# Patient Record
Sex: Male | Born: 1965 | Race: White | Hispanic: No | Marital: Single | Smoking: Current every day smoker
Health system: Southern US, Community
[De-identification: ages and names within clinical notes are randomized; demographics above are authoritative.]

## PROBLEM LIST (undated history)

## (undated) DIAGNOSIS — E785 Hyperlipidemia, unspecified: Secondary | ICD-10-CM

## (undated) DIAGNOSIS — Z720 Tobacco use: Secondary | ICD-10-CM

## (undated) DIAGNOSIS — I255 Ischemic cardiomyopathy: Secondary | ICD-10-CM

## (undated) DIAGNOSIS — I10 Essential (primary) hypertension: Secondary | ICD-10-CM

## (undated) DIAGNOSIS — I2510 Atherosclerotic heart disease of native coronary artery without angina pectoris: Secondary | ICD-10-CM

## (undated) DIAGNOSIS — I219 Acute myocardial infarction, unspecified: Secondary | ICD-10-CM

## (undated) HISTORY — PX: APPENDECTOMY: SHX54

## (undated) HISTORY — DX: Hyperlipidemia, unspecified: E78.5

## (undated) HISTORY — DX: Atherosclerotic heart disease of native coronary artery without angina pectoris: I25.10

## (undated) HISTORY — DX: Ischemic cardiomyopathy: I25.5

## (undated) HISTORY — DX: Tobacco use: Z72.0

## (undated) HISTORY — DX: Essential (primary) hypertension: I10

## (undated) HISTORY — DX: Acute myocardial infarction, unspecified: I21.9

---

## 2010-07-17 DIAGNOSIS — I2510 Atherosclerotic heart disease of native coronary artery without angina pectoris: Secondary | ICD-10-CM

## 2010-07-17 HISTORY — DX: Atherosclerotic heart disease of native coronary artery without angina pectoris: I25.10

## 2010-07-17 HISTORY — PX: CARDIAC CATHETERIZATION: SHX172

## 2010-07-17 HISTORY — PX: CORONARY ANGIOPLASTY WITH STENT PLACEMENT: SHX49

## 2010-07-21 ENCOUNTER — Inpatient Hospital Stay (HOSPITAL_COMMUNITY)
Admission: RE | Admit: 2010-07-21 | Discharge: 2010-07-23 | Payer: Self-pay | Source: Home / Self Care | Attending: Cardiovascular Disease | Admitting: Cardiovascular Disease

## 2010-08-06 ENCOUNTER — Ambulatory Visit: Payer: Self-pay | Admitting: Physician Assistant

## 2010-08-06 ENCOUNTER — Encounter: Payer: Self-pay | Admitting: Physician Assistant

## 2010-08-06 DIAGNOSIS — F17200 Nicotine dependence, unspecified, uncomplicated: Secondary | ICD-10-CM

## 2010-08-06 DIAGNOSIS — I10 Essential (primary) hypertension: Secondary | ICD-10-CM

## 2010-08-06 DIAGNOSIS — E785 Hyperlipidemia, unspecified: Secondary | ICD-10-CM

## 2010-08-06 DIAGNOSIS — I2510 Atherosclerotic heart disease of native coronary artery without angina pectoris: Secondary | ICD-10-CM | POA: Insufficient documentation

## 2010-08-06 DIAGNOSIS — I2119 ST elevation (STEMI) myocardial infarction involving other coronary artery of inferior wall: Secondary | ICD-10-CM

## 2010-08-06 DIAGNOSIS — F1010 Alcohol abuse, uncomplicated: Secondary | ICD-10-CM | POA: Insufficient documentation

## 2010-08-06 DIAGNOSIS — R21 Rash and other nonspecific skin eruption: Secondary | ICD-10-CM

## 2010-09-15 ENCOUNTER — Ambulatory Visit
Admission: RE | Admit: 2010-09-15 | Discharge: 2010-09-15 | Payer: Self-pay | Source: Home / Self Care | Attending: Cardiovascular Disease | Admitting: Cardiovascular Disease

## 2010-09-18 LAB — CONVERTED CEMR LAB
CO2: 23 meq/L (ref 19–32)
Chloride: 105 meq/L (ref 96–112)

## 2010-09-18 NOTE — Assessment & Plan Note (Signed)
Summary: eph/jml   Visit Type:  EPH Primary Provider:  NO PCP AT THIS TIME  CC:  pt states no cardIac complaints today..pt did not have orthostatics these were put in on the wrong pt.  History of Present Illness: Primary Cardiologist:  Dr. Tonny Bollman  Jeffery Prince is a 45 year old male who presented to [facility] on December 5 with an inferior ST elevation myocardial infarction.  He was treated with a bare-metal stent to the right posterior lateral branch which was the infarct vessel.  He also underwent IVUS guided PCI with a bare-metal stent to the PDA and the proximal RCA.  His ejection fraction was 45-50% with inferior and inferobasal akinesis.  He returns today for followup.  He is doing well.  He denies chest pain.  He denies shortness of breath.  He denies syncope.  He denies orthopnea, PND or edema.  He is tolerating his medications well.  He is compliant with his medications.  He has a rash.  This is on his upper extremities.  He usually gets this in the wintertime.  He began to develop the rash prior to going to the hospital with his MI.  It is not significantly different from the past.  Current Medications (verified): 1)  Aspirin 81 Mg Tbec (Aspirin) .... Take One Tablet By Mouth Daily 2)  Nitrostat 0.4 Mg Subl (Nitroglycerin) .Marland Kitchen.. 1 Tablet Under Tongue At Onset of Chest Pain; You May Repeat Every 5 Minutes For Up To 3 Doses. 3)  Pravastatin Sodium 40 Mg Tabs (Pravastatin Sodium) .Marland Kitchen.. 1 Tab At Bedtime 4)  Lisinopril 10 Mg Tabs (Lisinopril) .Marland Kitchen.. 1 Tab Once Daily 5)  Effient 10 Mg Tabs (Prasugrel Hcl) .Marland Kitchen.. 1 Tab Once Daily 6)  Metoprolol Tartrate 25 Mg Tabs (Metoprolol Tartrate) .Marland Kitchen.. 1 Tab Two Times A Day  Allergies (verified): No Known Drug Allergies  Past History:  Past Medical History: Last updated: 08/05/2010 CAD   a.  07/21/2010: Inf STEMI tx with BMS to PL Br. of RCA (infarct vessel) . . . [place]   b.  PCI at time of MI 07/21/2010: IVUS guided BMS  to PDA and Prox RCA   c.  cath 07/21/2010: residual - LM 30-40%; LAD 50% prox + 30-40% dist; D1 50% prox; CFX serial 30-40%;  Ischemic Cardiomyopathy   a.  EF 45-50% with inf and inf-basal AK (cath 07/21/2010) Ongoing tobacco abuse.  EtOH abuse.  Dyslipidemia  Hypertension  Social History: Last updated: 08/05/2010 The patient is unmarried and has no children.   He works in Surveyor, quantity.   He smokes one and a half packs per day.   He has admitted to heavy alcohol use.   Review of Systems       As per  the HPI.  All other systems reviewed and negative.   Vital Signs:  Patient profile:   45 year old male Height:      68 inches Weight:      206.50 pounds BMI:     31.51 Pulse rate:   64 / minute Pulse (ortho):   73 / minute Pulse rhythm:   regular BP sitting:   118 / 82  (left arm) BP standing:   134 / 89 Cuff size:   regular  Vitals Entered By: Danielle Rankin, CMA (August 06, 2010 9:27 AM)  Serial Vital Signs/Assessments:  Time      Position  BP       Pulse  Resp  Temp  By 9:43 AM   Lying RA  128/79   [address], [place] 9:46      Sitting   124/84   [address] [place] 9:48      Standing  134/89   [address], [place] 3'08      Standing  139/90   72                    Danielle Rankin, [place] 9:55      Standing  142/93                         Danielle Rankin, CMA  Comments: 9:43 AM no sxms By: Danielle Rankin, CMA  9:46 dizzy By: Danielle Rankin, CMA  9:48 dizzy By: Danielle Rankin, CMA  [phone] dizzy By: Danielle Rankin, CMA  9:55 dizzy By: Danielle Rankin, CMA  Of note, the above orthostatics were entered on the wrong patient.  Physical Exam  General:  Well nourished, well developed, in no acute distress HEENT: normal Neck: no JVD Cardiac:  normal S1, S2; RRR; no murmur Lungs:  clear to auscultation bilaterally, no wheezing, rhonchi or rales Abd: soft, nontender, no hepatomegaly Ext: no edema; R radial site without  hematoma or bruit Skin: warm and dry; scattered erythematous dry patches to bilat upper ext Neuro:  CNs 2-12 intact, no focal abnormalities noted    EKG  Procedure date:  08/06/2010  Findings:      Normal Sinus Rhythm Heart rate 64 Normal axis Early repolarization noted No ischemic changes  Impression & Recommendations:  Problem # 1:  ACUT MYOCARD INFARCT OTH INF WALL EPIS CARE UNS (ICD-410.40)  He is doing well.  He is continuing his aspirin and Effient.  We will make sure he is enrolled in the assistance program and provide him with samples if available.  He is unable to proceed with cardiac rehabilitation due to his job.  He has already gone back to work.  We discussed possibly doing half-time for a while.  He will try to do this.  He does not do that much exertion.  He does live in [place] and would like to followup there.  I will bring him back in 6 weeks for routine followup and to meet Dr. Kirke Corin.  Orders: EKG w/ Interpretation (93000)  Problem # 2:  CORONARY ATHEROSCLEROSIS NATIVE CORONARY ARTERY (ICD-414.01)  Continue ASA and Effient.  Orders: EKG w/ Interpretation (93000) TLB-BMP (Basic Metabolic Panel-BMET) (80048-METABOL)  Problem # 3:  HYPERTENSION, BENIGN (ICD-401.1)  Controlled. Check BMET as ACE is new.  Orders: EKG w/ Interpretation (93000) TLB-BMP (Basic Metabolic Panel-BMET) (80048-METABOL)  Problem # 4:  HYPERLIPIDEMIA (ICD-272.4) Check FLP and LFTs in 8 weeks.  His updated medication list for this problem includes:    Pravastatin Sodium 40 Mg Tabs (Pravastatin sodium) .Marland Kitchen... 1 tab at bedtime  Problem # 5:  SKIN RASH (ICD-782.1) It looks like dry skin.  It does not appear a drug reaction. I have rec. changes to his hygiene. I have given him TAC cream to use as needed. If the rash is getting worse or changing, he should follow  up.  Patient Instructions: 1)  Try to use [place] soap instead of Dial. 2)  Try to avoid using soap on your entire body  every day in the shower to avoid drying. 3)  Put a moisturizer on after your shower every day. 4)  Use the triamcinolone two times a day as needed.  Just apply a small amount to any spots that are itchy. 5)  You can get Triamcinolone at [facility] for $4. 6)  Do not ever run out of Effient. 7)  Do not ever stop taking Aspirin. 8)  Your physician recommends that you schedule a follow-up appointment in: IN 6 WEEKS WITH DR. Kirke Corin IN [facility] OFFICE 9)  Your physician recommends that you return for a FASTING lipid profile: LIPIDS AND LIVER PROFILE IN 8 WEEKS 10)  Your physician recommends that you return for lab work EA:VWUJW FOR BMET FOR HYPERTENSION 11)  Your physician recommends you become established with a Primary Care Physician in [place]-to call [place] office for phone numbers. [place] office phone number [phone]. Prescriptions: TRIAMCINOLONE ACETONIDE 0.1 % CREA (TRIAMCINOLONE ACETONIDE) Apply thin amount to areas that itch two times a day as needed  #30 grams x 0   Entered and Authorized by:   Tereso Newcomer PA-C   Signed by:   Tereso Newcomer PA-C on 08/06/2010   Method used:   Print then Give to Patient   RxID:   [id number]

## 2010-09-22 ENCOUNTER — Telehealth: Payer: Self-pay | Admitting: Cardiovascular Disease

## 2010-09-29 NOTE — Assessment & Plan Note (Signed)
[facility]                       [place] [facility] OFFICE NOTE  NAME:Jeffery Prince, Jeffery Prince                        MRN:          [id number] DATE:09/15/2010                            DOB:          1965/12/27   HISTORY OF PRESENT ILLNESS:  Jeffery Prince is a 45 year old gentleman who is here today for a followup visit.  He has the following problem list: 1. Coronary artery disease status post inferior ST elevation     myocardial infarction in December 2011.  Cardiac catheterization at     that time showed a total occlusion of right posterior AV groove     artery, 80% proximal PDA stenosis, and an 80% proximal RCA     stenosis.  He underwent an angioplasty, and 3 bare metal stent     placement to the right posterior AV groove artery, proximal PDA as     well as proximal right coronary artery.  The rest of his cardiac     catheterization showed 30-40% distal left main stenosis, 50%     proximal LAD stenosis, and 30-40% left circumflex disease.     Ejection fraction was 45-50%. 2. Tobacco use. 3. History of alcohol abuse with binge drinking on weekends. 4. Hypertension. 5. Borderline hyperlipidemia.  INTERVAL HISTORY:  Jeffery Prince has been doing very well since he was treated for his ST elevation myocardial infarction back in December.  He has not had any episodes of chest pain or dyspnea.  He continues to take his medications without any reported side effects.  He cannot afford Effient and has been dependent on samples and assistance.  He has cut down significantly on his alcohol drinking.  He continues to smoke so and is having hard time quitting.  He is not involved in regular exercise program.  MEDICATIONS: 1. Aspirin 81 mg once daily. 2. Pravastatin 40 mg once daily. 3. Lisinopril 10 mg once daily. 4. Effient 10 mg once daily. 5. Metoprolol 25 mg twice daily.  ALLERGIES:  No known drug allergies.  SOCIAL HISTORY:  Remarkable for smoking half-a-pack  per day for many years.  He used to binge drink beer on weekends but has cut down significantly.  He works in a Interior and spatial designer.  PAST SURGICAL HISTORY:  Appendectomy.  PHYSICAL EXAMINATION:  GENERAL:  The patient appears to be at his stated age and in no acute distress. VITAL SIGNS:  Weight is 211.8 pounds, blood pressure is 122/76, pulse is 69, oxygen saturation is 96% on room air. HEENT:  Normocephalic and atraumatic. NECK:  No JVD or carotid bruits. RESPIRATORY:  Normal respiratory effort with no use of accessory muscles.  Clear to auscultation, reveals normal breath sounds. CARDIOVASCULAR:  Normal PMI.  Normal S1 and S2 with no gallops or murmurs. ABDOMEN:  Benign, nontender, and nondistended. EXTREMITIES:  With no clubbing, cyanosis, or edema. SKIN:  Warm and dry with no rash. PSYCHIATRIC:  He is alert, oriented x3 with normal mood and affect. MUSCULOSKELETAL:  There is normal muscle strength in the upper and lower extremities.  IMPRESSION:  Electrocardiogram was performed which showed normal sinus rhythm with no significant ST or  T-wave changes.  There are no Q-waves.  IMPRESSION: 1. Coronary artery disease status post inferior ST elevation     myocardial infarction which was treated emergently with an     angioplasty and 3 bare metal stents to the right coronary artery.     At this time, I agree with current management.  He is not having     any symptoms suggestive of angina, heart failure, or arrhythmia.     He will need to continue aspirin indefinitely.  Regarding Effient,     I think we will treat him with 3 months given that he had a bare     metal stent.  Obviously, he would benefit from continuing the     medication for a total of 9-12 months due to his recent acute     coronary syndrome.  However, he is not able to afford the     medication.  I provided him today with 6 week supplies so we will     ensure that he at least took 3 months of Effient.  I instructed  him     to increase aspirin to 325 mg once daily after he is done with     Effient.  We will continue also with lisinopril 10 mg once daily,     metoprolol, and pravastatin. 2. Hypertension:  Continue current medications.  Blood pressure is     well-controlled. 3. Borderline hyperlipidemia:  We will request a fasting lipid and     liver profile.  We will continue pravastatin for now, but we will     consider switching to simvastatin if his LDL is still above 70.  I     discussed with him the importance of lifestyle changes including     diet, exercise, and smoking cessation. 4. Tobacco use.  The patient was counseled regarding the importance of     smoking cessation.  He will follow up with me in 4 months from now     or earlier as needed.    Lorine Bears, MD Electronically Signed   MA/MedQ  DD: 09/15/2010  DT: 09/15/2010  Job #: [id number]

## 2010-10-02 NOTE — Progress Notes (Signed)
Summary: lab results  Phone Note Outgoing Call   Call placed by: Dessie Coma  LPN,  September 22, 2010 2:43 PM Call placed to: Patient Summary of Call: LMVM-for patient to call this nurse back re: lab results.  Follow-up for Phone Call        Phone call completed:  Sister returned call since patient is at work.  Informed her to tell him labs are fine.  Cholesterol is OK at 13.  His HDL is too low though at 32.2-need to exercise more. Follow-up by: Dessie Coma  LPN,  September 22, 2010 2:46 PM

## 2010-10-28 LAB — COMPREHENSIVE METABOLIC PANEL
ALT: 27 U/L (ref 0–53)
Alkaline Phosphatase: 81 U/L (ref 39–117)
BUN: 2 mg/dL — ABNORMAL LOW (ref 6–23)
Chloride: 104 mEq/L (ref 96–112)
GFR calc Af Amer: >60 mL/min (ref >60)
Glucose, Bld: 99 mg/dL (ref 70–99)

## 2010-10-28 LAB — HEMOGLOBIN A1C
Hgb A1c MFr Bld: 6 % — ABNORMAL HIGH (ref <5.7)
Mean Plasma Glucose: 126 mg/dL — ABNORMAL HIGH (ref <117)

## 2010-10-28 LAB — POCT I-STAT, CHEM 8
BUN: 4 mg/dL — ABNORMAL LOW (ref 6–23)
Calcium, Ion: 1.03 mmol/L — ABNORMAL LOW (ref 1.12–1.32)
Chloride: 99 mEq/L (ref 96–112)
Glucose, Bld: 109 mg/dL — ABNORMAL HIGH (ref 70–99)
HCT: 44 % (ref 39.0–52.0)
Hemoglobin: 15 g/dL (ref 13.0–17.0)
Potassium: 3.6 mEq/L (ref 3.5–5.1)
Sodium: 133 mEq/L — ABNORMAL LOW (ref 135–145)

## 2010-10-28 LAB — BASIC METABOLIC PANEL
BUN: 4 mg/dL — ABNORMAL LOW (ref 6–23)
CO2: 26 mEq/L (ref 19–32)
Calcium: 8.9 mg/dL (ref 8.4–10.5)
Calcium: 9 mg/dL (ref 8.4–10.5)
Chloride: 107 mEq/L (ref 96–112)
GFR calc Af Amer: >60 mL/min (ref >60)
GFR calc non Af Amer: >60 mL/min (ref >60)
GFR calc non Af Amer: >60 mL/min (ref >60)

## 2010-10-28 LAB — DIFFERENTIAL
Basophils Absolute: 0.1 10*3/uL (ref 0.0–0.1)
Basophils Relative: 1 % (ref 0–1)
Eosinophils Absolute: 0 10*3/uL (ref 0.0–0.7)
Lymphs Abs: 2.9 10*3/uL (ref 0.7–4.0)
Monocytes Relative: 7 % (ref 3–12)

## 2010-10-28 LAB — PLATELET INHIBITION P2Y12: Platelet Function Baseline: 219 [PRU] (ref 194–418)

## 2010-10-28 LAB — CARDIAC PANEL(CRET KIN+CKTOT+MB+TROPI)
CK, MB: 1.8 ng/mL (ref 0.3–4.0)
CK, MB: 2.1 ng/mL (ref 0.3–4.0)
Relative Index: 1.8 (ref 0.0–2.5)
Relative Index: 10.4 — ABNORMAL HIGH (ref 0.0–2.5)
Relative Index: INVALID (ref 0.0–2.5)
Troponin I: 0.05 ng/mL (ref 0.00–0.06)
Troponin I: 6.92 ng/mL (ref 0.00–0.06)

## 2010-10-28 LAB — LIPID PANEL
HDL: 32 mg/dL — ABNORMAL LOW (ref >39)
LDL Cholesterol: 104 mg/dL — ABNORMAL HIGH (ref 0–99)
Total CHOL/HDL Ratio: 5.4 RATIO

## 2010-10-28 LAB — CBC
HCT: 46.9 % (ref 39.0–52.0)
MCH: 32.2 pg (ref 26.0–34.0)
MCHC: 33.1 g/dL (ref 30.0–36.0)
Platelets: 233 10*3/uL (ref 150–400)
RDW: 15.6 % — ABNORMAL HIGH (ref 11.5–15.5)
WBC: 9.2 10*3/uL (ref 4.0–10.5)

## 2010-11-04 ENCOUNTER — Telehealth: Payer: Self-pay | Admitting: *Deleted

## 2010-11-04 NOTE — Telephone Encounter (Signed)
A user error has taken place: encounter opened in error, closed for administrative reasons.

## 2010-12-16 HISTORY — PX: CORONARY ANGIOPLASTY WITH STENT PLACEMENT: SHX49

## 2010-12-22 ENCOUNTER — Encounter: Payer: Self-pay | Admitting: Cardiovascular Disease

## 2010-12-25 ENCOUNTER — Other Ambulatory Visit: Payer: Self-pay | Admitting: *Deleted

## 2010-12-25 ENCOUNTER — Ambulatory Visit (INDEPENDENT_AMBULATORY_CARE_PROVIDER_SITE_OTHER): Payer: Self-pay | Admitting: Cardiovascular Disease

## 2010-12-25 ENCOUNTER — Encounter: Payer: Self-pay | Admitting: Cardiovascular Disease

## 2010-12-25 DIAGNOSIS — I2510 Atherosclerotic heart disease of native coronary artery without angina pectoris: Secondary | ICD-10-CM

## 2010-12-25 DIAGNOSIS — F17200 Nicotine dependence, unspecified, uncomplicated: Secondary | ICD-10-CM

## 2010-12-25 DIAGNOSIS — E785 Hyperlipidemia, unspecified: Secondary | ICD-10-CM | POA: Insufficient documentation

## 2010-12-25 DIAGNOSIS — I10 Essential (primary) hypertension: Secondary | ICD-10-CM | POA: Insufficient documentation

## 2010-12-25 DIAGNOSIS — Z720 Tobacco use: Secondary | ICD-10-CM | POA: Insufficient documentation

## 2010-12-25 MED ORDER — PRAVASTATIN SODIUM 40 MG PO TABS: 40.0000 mg | ORAL_TABLET | Freq: Every day | ORAL | Status: DC

## 2010-12-25 MED ORDER — METOPROLOL TARTRATE 25 MG PO TABS: 25.0000 mg | ORAL_TABLET | Freq: Two times a day (BID) | ORAL | Status: DC

## 2010-12-25 MED ORDER — LISINOPRIL 20 MG PO TABS: 20.0000 mg | ORAL_TABLET | Freq: Every day | ORAL | Status: DC

## 2010-12-25 NOTE — Assessment & Plan Note (Signed)
The patient is doing reasonably well from a cardiac standpoint. He is not having any symptoms suggestive of angina, heart failure or arrhythmia. Would continue with medical therapy. He is on aspirin 325 mg once daily. He was on dual antiplatelet therapy for 3 months. Continue treatment with a beta blocker and an ACE inhibitor. I discussed with him the importance of lifestyle changes including healthy diet and regular exercise.

## 2010-12-25 NOTE — Progress Notes (Signed)
HPI  Jeffery Prince is a 45 year old gentleman who is here today for a followup visit. Overall, he has been doing reasonably well. He has not had any chest pain similar to his previous myocardial infarction. He denies any significant dyspnea. There is no palpitations or dizziness. He finished 3 months treatment with Effient. He continues to take his other heart medications. Unfortunately, he continues to smoke one pack per day. He is trying to quit but does not seem to be strongly determined. He mentions that he is under a lot of stress at work.  No Known Allergies   Current Outpatient Prescriptions on File Prior to Visit  Medication Sig Dispense Refill  . DISCONTD: aspirin 81 MG tablet Take 81 mg by mouth daily.        Marland Kitchen DISCONTD: lisinopril (PRINIVIL,ZESTRIL) 10 MG tablet Take 10 mg by mouth daily.        Marland Kitchen DISCONTD: metoprolol tartrate (LOPRESSOR) 25 MG tablet Take 25 mg by mouth 2 (two) times daily.        Marland Kitchen DISCONTD: pravastatin (PRAVACHOL) 40 MG tablet Take 40 mg by mouth daily.        Marland Kitchen DISCONTD: prasugrel (EFFIENT) 10 MG TABS Take 10 mg by mouth daily.           Past Medical History  Diagnosis Date  . Ischemic cardiomyopathy     EF: 45-50% post MI  . MI (myocardial infarction)   . CAD (coronary artery disease) 07/2010    s/p inferior MI  . HLD (hyperlipidemia)   . HTN (hypertension)   . Tobacco abuse      Past Surgical History  Procedure Date  . Appendectomy   . Cardiac catheterization 07/2010    occluded R PAV artery with 80% stenosis in RPDA and proximal RCA, LM: 30-40%, LAD: 50% proximal, LCX: 30-40%.  . Coronary angioplasty with stent placement 07/2010    3 bare metal stents to proximal RCA, RPAV artery and RPDA.     History reviewed. No pertinent family history.   History   Social History  . Marital Status: Single    Spouse Name: N/A    Number of Children: N/A  . 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 Smoking status: Current Everyday Smoker -- 0.5 packs/day  . Smokeless tobacco: Not on file  . Alcohol Use: Yes  . Drug Use: Not on file  . Sexually Active: Not on file   Other Topics Concern  . Not on file   Social History Narrative  . No narrative on file       PHYSICAL EXAM   BP 166/99  Pulse 65  Ht 5\' 8"  (1.727 m)  Wt 204 lb (92.534 kg)  BMI 31.02 kg/m2  SpO2 96%  Constitutional: He is oriented to person, place, and time. He appears well-developed and well-nourished. No distress.  HENT: No nasal discharge.  Head: Normocephalic and atraumatic.  Eyes: Pupils are equal, round, and reactive to light. Right eye exhibits no discharge. Left eye exhibits no discharge.  Neck: Normal range of motion. Neck supple. No JVD present. No thyromegaly present.  Cardiovascular: Normal rate, regular rhythm, normal heart sounds and intact distal pulses. Exam reveals no gallop and no friction rub.  No murmur heard.  Pulmonary/Chest: Effort normal and breath sounds normal. No stridor. No respiratory distress. He has no wheezes. He has no rales. He exhibits no tenderness.  Abdominal: Soft. Bowel  sounds are normal. He exhibits no distension. There is no tenderness. There is no rebound and no guarding.  Musculoskeletal: Normal range of motion. He exhibits no edema and no tenderness.  Neurological: He is alert and oriented to person, place, and time. Coordination normal.  Skin: Skin is warm and dry. No rash noted. He is not diaphoretic. No erythema. No pallor.  Psychiatric: He has a normal mood and affect. His behavior is normal. Judgment and thought content normal.        ASSESSMENT AND PLAN

## 2010-12-25 NOTE — Assessment & Plan Note (Signed)
His most recent lipid profile showed a total cholesterol of 139, HDL 52, triglyceride of 159 and an LDL of 79. Would continue treatment with the pravastatin for now. Due to presence of diffuse coronary artery disease, the patient would benefit from more aggressive control of his lipids. I plan on switching him to generic atorvastatin once it becomes more affordable.

## 2010-12-25 NOTE — Assessment & Plan Note (Signed)
His blood pressure is elevated. I will go ahead and increase lisinopril to 20 mg once daily.

## 2010-12-25 NOTE — Assessment & Plan Note (Signed)
I again advised the patient strongly to quit smoking.

## 2010-12-25 NOTE — Patient Instructions (Addendum)
Your physician has recommended you make the following change in your medication: increase lisinopril to 20 mg daily  Your physician recommends that you schedule a follow-up appointment in: 6 months

## 2011-01-22 ENCOUNTER — Ambulatory Visit (INDEPENDENT_AMBULATORY_CARE_PROVIDER_SITE_OTHER): Payer: Self-pay | Admitting: Cardiovascular Disease

## 2011-01-22 ENCOUNTER — Encounter: Payer: Self-pay | Admitting: Cardiovascular Disease

## 2011-01-22 DIAGNOSIS — E785 Hyperlipidemia, unspecified: Secondary | ICD-10-CM

## 2011-01-22 DIAGNOSIS — Z720 Tobacco use: Secondary | ICD-10-CM

## 2011-01-22 DIAGNOSIS — F17200 Nicotine dependence, unspecified, uncomplicated: Secondary | ICD-10-CM

## 2011-01-22 DIAGNOSIS — I2510 Atherosclerotic heart disease of native coronary artery without angina pectoris: Secondary | ICD-10-CM

## 2011-01-22 NOTE — Assessment & Plan Note (Signed)
Continue treatment with pravastatin. He will likely need a more potent stent in the future once she is able to afford it.

## 2011-01-22 NOTE — Patient Instructions (Signed)
Your physician recommends that you schedule a follow-up appointment in: 4 months  

## 2011-01-22 NOTE — Progress Notes (Signed)
HPI   This is a 45 year old male who is here for followup visit. He has known history of coronary artery disease status post inferior myocardial infarction in November of 2011. He was stable from a cardiac standpoint up until recently when he presented on May 25 to [facility] with chest pain. His ECG showed no acute changes. He did rule out for myocardial infarction with a peak troponin of 8. He was transferred to [facility] after 2 days where he underwent cardiac catheterization which showed severe in-stent restenosis in the right posterior AV groove artery as well as the right PDA. The proximal RCA stent was patent. He underwent an angioplasty and 2 drug-eluting stent placement after [place] angioplasty both branches. 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 Since his hospital discharge, the patient has not had any recurrent chest pain. He is trying to quit smoking. He is taking his medications regularly.   No Known Allergies   Current Outpatient Prescriptions on File Prior to Visit  Medication Sig Dispense Refill  . lisinopril (PRINIVIL,ZESTRIL) 20 MG tablet Take 1 tablet (20 mg total) by mouth daily.  30 tablet  6  . metoprolol tartrate (LOPRESSOR) 25 MG tablet Take 1 tablet (25 mg total) by mouth 2 (two) times daily.  60 tablet  6  . pravastatin (PRAVACHOL) 40 MG tablet Take 1 tablet (40 mg total) by mouth daily.  30 tablet  6  . DISCONTD: aspirin 325 MG tablet Take 325 mg by mouth daily.           Past Medical History  Diagnosis Date  . Ischemic cardiomyopathy     EF: 45-50% post MI  . MI (myocardial infarction)   . CAD (coronary artery disease) 07/2010    s/p inferior MI  . HLD (hyperlipidemia)   . HTN (hypertension)   . Tobacco abuse      Past Surgical History  Procedure Date  . Appendectomy   . Cardiac catheterization 07/2010    occluded R PAV artery with 80% stenosis in RPDA and proximal RCA, LM:  30-40%, LAD: 50% proximal, LCX: 30-40%.  . Coronary angioplasty with stent placement 07/2010    3 bare metal stents to proximal RCA, RPAV artery and RPDA.  Marland Kitchen Coronary angioplasty with stent placement 12/2010    2 DES to R PAV (2.5 X 18 mm Xience)and RPDA (3.0 X 18 mm) for severe ISR     History reviewed. No pertinent family history.   History   Social History  . Marital Status: Single    Spouse Name: N/A    Number of Children: N/A  . 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 Smoking status: Current Everyday Smoker -- 0.5 packs/day  . Smokeless tobacco: Not on file  . Alcohol Use: Yes  . Drug Use: Not on file  . Sexually Active: Not on file   Other Topics Concern  . Not on file   Social History Narrative  . No narrative on file       PHYSICAL EXAM   BP 139/88  Pulse 74  Ht 5\' 8"  (1.727 m)  Wt 199 lb (90.266 kg)  BMI 30.26 kg/m2  SpO2 95%  Constitutional: He is oriented to person, place, and time. He appears well-developed and well-nourished. No distress.  HENT: No nasal discharge.  Head: Normocephalic and atraumatic.  Eyes: Pupils are equal, round,  and reactive to light. Right eye exhibits no discharge. Left eye exhibits no discharge.  Neck: Normal range of motion. Neck supple. No JVD present. No thyromegaly present.  Cardiovascular: Normal rate, regular rhythm, normal heart sounds and intact distal pulses. Exam reveals no gallop and no friction rub.  No murmur heard.  Pulmonary/Chest: Effort normal and breath sounds normal. No stridor. No respiratory distress. He has no wheezes. He has no rales. He exhibits no tenderness.  Abdominal: Soft. Bowel sounds are normal. He exhibits no distension. There is no tenderness. There is no rebound and no guarding.  Musculoskeletal: Normal range of motion. He exhibits no edema and no tenderness.  Neurological: He is alert and oriented to person, place, and time.  Coordination normal.  Skin: Skin is warm and dry. No rash noted. He is not diaphoretic. No erythema. No pallor.  Psychiatric: He has a normal mood and affect. His behavior is normal. Judgment and thought content normal.        ASSESSMENT AND PLAN

## 2011-01-22 NOTE — Assessment & Plan Note (Signed)
He is actually trying to quit smoking and this was discussed with him again during this visit.

## 2011-01-22 NOTE — Assessment & Plan Note (Signed)
The patient had a recent non-ST elevation myocardial infarction due to severe in-stent restenosis in the distal right coronary artery branches. He had an angioplasty and drug-eluting stent placement without complications. He seems to be doing well since then. I recommend continuing dual antiplatelet therapy for one year. I discussed with him the importance of taking Effient daily. I provided him with 6 week supply. I asked him to fill out the paperwork to see if he qualifies for the drug from the company. I again had a prolonged discussion with him about the importance of lifestyle changes.

## 2011-01-26 ENCOUNTER — Encounter: Payer: Self-pay | Admitting: Cardiovascular Disease

## 2011-04-13 ENCOUNTER — Encounter: Payer: Self-pay | Admitting: Cardiovascular Disease

## 2011-12-21 IMAGING — CR DG CHEST 1V PORT
1 series · 1 of 1 positions shown · non-contrast
Comparison: None

CLINICAL DATA: Myocardial infarction.  Short of breath

PORTABLE CHEST - 1 VIEW

[view not recorded]
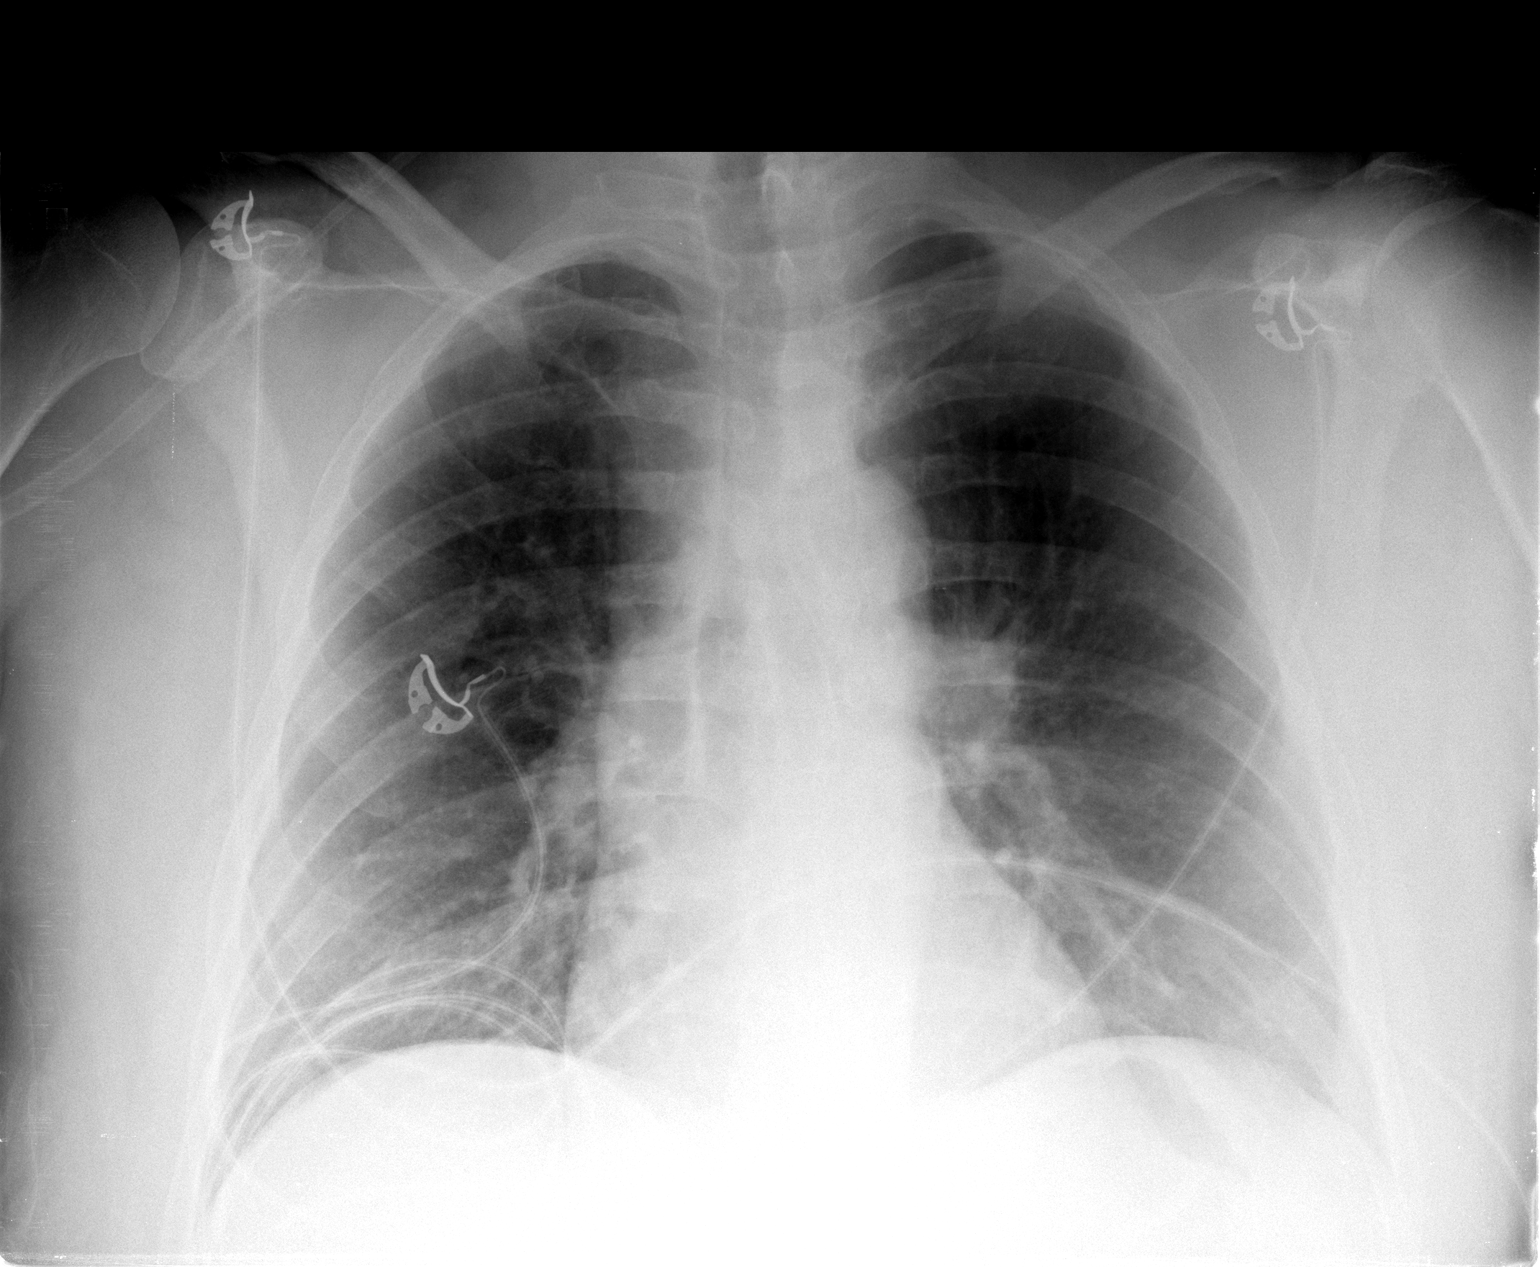

[1 of 1 positions shown; findings below may reference images not displayed]

FINDINGS: Heart size is normal and vascularity is normal.  Lungs
are clear without infiltrate or effusion.
IMPRESSION: No active cardiopulmonary disease.

## 2015-12-30 ENCOUNTER — Inpatient Hospital Stay (HOSPITAL_COMMUNITY)
Admission: AD | Admit: 2015-12-30 | Discharge: 2016-01-07 | DRG: 023 | Disposition: A | Discharge status: Skilled Nursing Facility | Payer: Self-pay | Source: Other Acute Inpatient Hospital | Attending: Neurosurgery | Admitting: Neurosurgery

## 2015-12-30 DIAGNOSIS — E785 Hyperlipidemia, unspecified: Secondary | ICD-10-CM | POA: Diagnosis present

## 2015-12-30 DIAGNOSIS — R4701 Aphasia: Secondary | ICD-10-CM | POA: Diagnosis present

## 2015-12-30 DIAGNOSIS — G936 Cerebral edema: Secondary | ICD-10-CM | POA: Diagnosis present

## 2015-12-30 DIAGNOSIS — K047 Periapical abscess without sinus: Secondary | ICD-10-CM

## 2015-12-30 DIAGNOSIS — K089 Disorder of teeth and supporting structures, unspecified: Secondary | ICD-10-CM | POA: Insufficient documentation

## 2015-12-30 DIAGNOSIS — Z79899 Other long term (current) drug therapy: Secondary | ICD-10-CM

## 2015-12-30 DIAGNOSIS — G060 Intracranial abscess and granuloma: Principal | ICD-10-CM | POA: Diagnosis present

## 2015-12-30 DIAGNOSIS — F17210 Nicotine dependence, cigarettes, uncomplicated: Secondary | ICD-10-CM | POA: Diagnosis present

## 2015-12-30 DIAGNOSIS — I10 Essential (primary) hypertension: Secondary | ICD-10-CM | POA: Diagnosis present

## 2015-12-30 DIAGNOSIS — F1010 Alcohol abuse, uncomplicated: Secondary | ICD-10-CM | POA: Diagnosis present

## 2015-12-30 DIAGNOSIS — Z9114 Patient's other noncompliance with medication regimen: Secondary | ICD-10-CM

## 2015-12-30 DIAGNOSIS — I252 Old myocardial infarction: Secondary | ICD-10-CM

## 2015-12-30 DIAGNOSIS — Z452 Encounter for adjustment and management of vascular access device: Secondary | ICD-10-CM

## 2015-12-30 DIAGNOSIS — D496 Neoplasm of unspecified behavior of brain: Secondary | ICD-10-CM

## 2015-12-30 DIAGNOSIS — I2510 Atherosclerotic heart disease of native coronary artery without angina pectoris: Secondary | ICD-10-CM | POA: Diagnosis present

## 2015-12-30 DIAGNOSIS — G9389 Other specified disorders of brain: Secondary | ICD-10-CM | POA: Insufficient documentation

## 2015-12-30 DIAGNOSIS — R40241 Glasgow coma scale score 13-15, unspecified time: Secondary | ICD-10-CM | POA: Diagnosis present

## 2015-12-30 DIAGNOSIS — I255 Ischemic cardiomyopathy: Secondary | ICD-10-CM | POA: Diagnosis present

## 2015-12-30 DIAGNOSIS — F17200 Nicotine dependence, unspecified, uncomplicated: Secondary | ICD-10-CM | POA: Diagnosis present

## 2015-12-30 DIAGNOSIS — Z955 Presence of coronary angioplasty implant and graft: Secondary | ICD-10-CM

## 2015-12-30 DIAGNOSIS — Z7982 Long term (current) use of aspirin: Secondary | ICD-10-CM

## 2015-12-30 LAB — CBC
HEMATOCRIT: 43.4 % (ref 39.0–52.0)
HEMOGLOBIN: 14.4 g/dL (ref 13.0–17.0)
MCH: 31.2 pg (ref 26.0–34.0)
MCHC: 33.2 g/dL (ref 30.0–36.0)
MCV: 94.1 fL (ref 78.0–100.0)
Platelets: 326 10*3/uL (ref 150–400)
RBC: 4.61 MIL/uL (ref 4.22–5.81)
RDW: 14.2 % (ref 11.5–15.5)
WBC: 11.7 10*3/uL — ABNORMAL HIGH (ref 4.0–10.5)

## 2015-12-30 LAB — BASIC METABOLIC PANEL
ANION GAP: 8 (ref 5–15)
BUN: 7 mg/dL (ref 6–20)
CHLORIDE: 105 mmol/L (ref 101–111)
CO2: 23 mmol/L (ref 22–32)
Calcium: 9 mg/dL (ref 8.9–10.3)
Creatinine, Ser: 0.53 mg/dL — ABNORMAL LOW (ref 0.61–1.24)
GFR calc non Af Amer: >60 mL/min (ref >60)
Glucose, Bld: 96 mg/dL (ref 65–99)
POTASSIUM: 3.6 mmol/L (ref 3.5–5.1)
Sodium: 136 mmol/L (ref 135–145)

## 2015-12-30 LAB — PROTIME-INR
INR: 1.1 (ref 0.00–1.49)
Prothrombin Time: 14.4 seconds (ref 11.6–15.2)

## 2015-12-30 LAB — MRSA PCR SCREENING: MRSA BY PCR: NEGATIVE

## 2015-12-30 MED ORDER — VITAMIN B-1 100 MG PO TABS
100.0000 mg | ORAL_TABLET | Freq: Every day | ORAL | Status: DC
Start: 2015-12-30 — End: 2016-01-07
  Administered 2015-12-30 – 2016-01-07 (×9): 100 mg via ORAL
  Filled 2015-12-30 (×10): qty 1

## 2015-12-30 MED ORDER — ADULT MULTIVITAMIN W/MINERALS CH
1.0000 | ORAL_TABLET | Freq: Every day | ORAL | Status: DC
  Administered 2015-12-30 – 2016-01-07 (×9): 1 via ORAL
  Filled 2015-12-30 (×9): qty 1

## 2015-12-30 MED ORDER — LORAZEPAM 1 MG PO TABS: 1.0000 mg | ORAL_TABLET | Freq: Four times a day (QID) | ORAL | Status: AC | PRN

## 2015-12-30 MED ORDER — PNEUMOCOCCAL VAC POLYVALENT 25 MCG/0.5ML IJ INJ
0.5000 mL | INJECTION | INTRAMUSCULAR | Status: AC
  Administered 2016-01-01: 0.5 mL via INTRAMUSCULAR
  Filled 2015-12-30: qty 0.5

## 2015-12-30 MED ORDER — SODIUM CHLORIDE 0.9 % IV SOLN
1000.0000 mg | Freq: Once | INTRAVENOUS | Status: AC
  Administered 2015-12-30: 1000 mg via INTRAVENOUS
  Filled 2015-12-30: qty 10

## 2015-12-30 MED ORDER — LORAZEPAM 2 MG/ML IJ SOLN
1.0000 mg | Freq: Four times a day (QID) | INTRAMUSCULAR | Status: AC | PRN
  Administered 2016-01-01: 1 mg via INTRAVENOUS
  Filled 2015-12-30: qty 1

## 2015-12-30 MED ORDER — THIAMINE HCL 100 MG/ML IJ SOLN: 100.0000 mg | Freq: Every day | INTRAMUSCULAR | Status: DC

## 2015-12-30 MED ORDER — SODIUM CHLORIDE 0.9 % IV SOLN
500.0000 mg | Freq: Two times a day (BID) | INTRAVENOUS | Status: DC
  Administered 2015-12-31 – 2016-01-03 (×7): 500 mg via INTRAVENOUS
  Filled 2015-12-30 (×10): qty 5

## 2015-12-30 MED ORDER — ATORVASTATIN CALCIUM 80 MG PO TABS
80.0000 mg | ORAL_TABLET | Freq: Every day | ORAL | Status: DC
  Administered 2016-01-01 – 2016-01-06 (×6): 80 mg via ORAL
  Filled 2015-12-30 (×6): qty 1

## 2015-12-30 MED ORDER — FOLIC ACID 1 MG PO TABS
1.0000 mg | ORAL_TABLET | Freq: Every day | ORAL | Status: DC
  Administered 2015-12-30 – 2016-01-07 (×9): 1 mg via ORAL
  Filled 2015-12-30 (×9): qty 1

## 2015-12-30 NOTE — Consult Note (Signed)
Reason for Consult: Left Brain mass Referring Physician: Dr. Gustavo Lah is an 50 y.o. male.  HPI: The patient is a 50 year old white male who went to the [facility] ER today after he "passed out". He underwent a cardiac workup which reportedly was negative. He was worked up with a head CT then a brain MRI which demonstrated a left frontal cystic mass. The patient was transferred to [facility] for further care. A neurosurgical consultation was requested.  Presently the patient is alert and pleasant. He denies headaches. When questioned, his syncopal episode sounds more like a seizure. The patient has a history of alcohol abuse. He admits to smoking marijuana. He denies intravenous drug abuse.  Past Medical History  Diagnosis Date  . Ischemic cardiomyopathy     EF: 45-50% post MI  . MI (myocardial infarction)   . CAD (coronary artery disease) 07/2010    s/p inferior MI  . HLD (hyperlipidemia)   . HTN (hypertension)   . Tobacco abuse     Past Surgical History  Procedure Laterality Date  . Appendectomy    . Cardiac catheterization  07/2010    occluded R PAV artery with 80% stenosis in RPDA and proximal RCA, LM: 30-40%, LAD: 50% proximal, LCX: 30-40%.  . Coronary angioplasty with stent placement  07/2010    3 bare metal stents to proximal RCA, RPAV artery and RPDA.  Marland Kitchen Coronary angioplasty with stent placement  12/2010    2 DES to R PAV (2.5 X 18 mm Xience)and RPDA (3.0 X 18 mm) for severe ISR    No family history on file.  Social History:  reports that he has been smoking.  He does not have any smokeless tobacco history on file. He reports that he drinks alcohol. His drug history is not on file.  Allergies: No Known Allergies  Medications:  I have reviewed the patient's current medications. Prior to Admission:  Prescriptions prior to admission  Medication Sig Dispense Refill Last Dose  . lisinopril (PRINIVIL,ZESTRIL) 20 MG tablet Take 1 tablet (20 mg total)  by mouth daily. 30 tablet 6 Taking  . metoprolol tartrate (LOPRESSOR) 25 MG tablet Take 1 tablet (25 mg total) by mouth 2 (two) times daily. (Patient not taking: Reported on 12/30/2015) 60 tablet 6 Not Taking at Unknown time  . pravastatin (PRAVACHOL) 40 MG tablet Take 1 tablet (40 mg total) by mouth daily. (Patient not taking: Reported on 12/30/2015) 30 tablet 6 Not Taking at Unknown time   Scheduled: . [START ON 12/31/2015] atorvastatin  80 mg Oral q1800  . folic acid  1 mg Oral Daily  . levETIRAcetam  1,000 mg Intravenous Once  . [START ON 12/31/2015] levETIRAcetam  500 mg Intravenous Q12H  . multivitamin with minerals  1 tablet Oral Daily  . [START ON 12/31/2015] pneumococcal 23 valent vaccine  0.5 mL Intramuscular Tomorrow-1000  . thiamine  100 mg Oral Daily   Or  . thiamine  100 mg Intravenous Daily   Continuous:  [id number] **OR** LORazepam Anti-infectives    None       No results found for this or any previous visit (from the past 48 hour(s)).  No results found.  ROS ; as above Blood pressure 143/102, pulse 66, temperature 98.2 F (36.8 C), temperature source Oral, resp. rate 14, height 5\' 8"  (1.727 m), weight 72.9 kg (160 lb 11.5 oz), SpO2 98 %. Physical Exam  General: An alert and pleasant 50 year old white male in no apparent distress.  HEENT  normocephalic, atraumatic, pupils equal round reactive to light, extraocular muscles intact  Neck: Supple. He has a decreased cervical range of motion. There are no masses.  Thorax: Symmetric  Abdomen: Soft  Extremities: Unremarkable  Neurologic exam: The patient is alert and oriented 2, person and [facility]. He thought this was April. Glasgow Coma Scale 14, [id number]. The patient has some mild word finding difficulties. The patient's strength is grossly normal his bilateral biceps, triceps, hand grip, quadriceps, gastrocnemius, dorsiflexors. Sensory function is intact to light touch sensation in all tested dermatomes  bilaterally. Cerebellar function is intact to rapid alternating movements of the upper extremities bilaterally. Cranial nerves II through XII were examined bilaterally and grossly normal. Vision and hearing are grossly normal bilaterally.  I have reviewed the patient's brain MRI performed at [facility] today with and without contrast. It demonstrates a cystic thin rim enhancing lesion with some mild surrounding edema and mass effect. This seems consistent with an abscess or possibly a tumor.  Assessment/Plan: Left frontal brain mass: I have discussed the situation with the patient. We have discussed the various treatment options including doing nothing versus surgery. I have described the surgical treatment option of a left craniotomy for presumed drainage of abscess versus resection of mass. We have discussed the risks of surgery including the risks of anesthesia, hemorrhage, infection, seizures, stroke, death, etc. I have answered all his questions. He has decided to proceed with surgery. We will plan to do this with [facility] neuro navigation. He will need to get a repeat MRI scan prior to surgery. I will tentatively plan to do the surgery tomorrow night.  Harald Quevedo D 12/30/2015, 7:53 PM

## 2015-12-30 NOTE — H&P (Addendum)
History and Physical    Jeffery Prince [id number] DOB: [birth year] DOA: 12/30/2015  Referring MD/NP/PA: Dr. Brion Aliment PCP: No primary care provider on file. Outpatient Specialists: None Patient coming from: [facility]  Chief Complaint: Syncope  HPI: Jeffery Prince is a 50 y.o. male with medical history significant of CAD s/p stent in 2012, has been off of his meds due to inability to afford for past 5 months.  Patient presented to [facility] after an episode of apparent syncope that lasted 10 mins.  There was also apparent confusion as well.  He was admitted to [facility], serial cardiac enzymes were negative, cardiac stress test was negative.  CT scan was performed in the ED at the time of admission and demonstrated apparent L frontal lobe mass.  MRI was performed which suggested a ring enhancing abscess vs mass.  He was transferred to [facility] for neurosurgical consult.  Review of Systems: As per HPI otherwise 10 point review of systems negative.    Past Medical History  Diagnosis Date  . Ischemic cardiomyopathy     EF: 45-50% post MI  . MI (myocardial infarction)   . CAD (coronary artery disease) 07/2010    s/p inferior MI  . HLD (hyperlipidemia)   . HTN (hypertension)   . Tobacco abuse     Past Surgical History  Procedure Laterality Date  . Appendectomy    . Cardiac catheterization  07/2010    occluded R PAV artery with 80% stenosis in RPDA and proximal RCA, LM: 30-40%, LAD: 50% proximal, LCX: 30-40%.  . Coronary angioplasty with stent placement  07/2010    3 bare metal stents to proximal RCA, RPAV artery and RPDA.  Marland Kitchen Coronary angioplasty with stent placement  12/2010    2 DES to R PAV (2.5 X 18 mm Xience)and RPDA (3.0 X 18 mm) for severe ISR     reports that he has been smoking.  He does not have any smokeless tobacco history on file. He reports that he drinks alcohol. His drug history is not on file.  No Known Allergies  No family history on  file.   Prior to Admission medications   Medication Sig Start Date End Date Taking? Authorizing Provider  aspirin 81 MG tablet Take 81 mg by mouth daily.      Historical Provider, MD  lisinopril (PRINIVIL,ZESTRIL) 20 MG tablet Take 1 tablet (20 mg total) by mouth daily. 12/25/10 12/25/11  Wellington Hampshire, MD  metoprolol tartrate (LOPRESSOR) 25 MG tablet Take 1 tablet (25 mg total) by mouth 2 (two) times daily. 12/25/10   Wellington Hampshire, MD  prasugrel (EFFIENT) 10 MG TABS Take 10 mg by mouth daily.      Historical Provider, MD  pravastatin (PRAVACHOL) 40 MG tablet Take 1 tablet (40 mg total) by mouth daily. 12/25/10   Wellington Hampshire, MD    Physical Exam: Filed Vitals:   12/30/15 1753 12/30/15 1905  BP: 143/102   Pulse: 66   Temp: 98 F (36.7 C) 98.2 F (36.8 C)  TempSrc: Oral Oral  Resp: 14   Height: 5\' 8"  (1.727 m)   Weight: 72.9 kg (160 lb 11.5 oz)   SpO2: 98%       Constitutional: NAD, calm, comfortable Filed Vitals:   12/30/15 1753 12/30/15 1905  BP: 143/102   Pulse: 66   Temp: 98 F (36.7 C) 98.2 F (36.8 C)  TempSrc: Oral Oral  Resp: 14   Height: 5\' 8"  (1.727 m)   Weight:  72.9 kg (160 lb 11.5 oz)   SpO2: 98%    Eyes: PERRL, lids and conjunctivae normal ENMT: Mucous membranes are moist. Posterior pharynx clear of any exudate or lesions.Normal dentition.  Neck: normal, supple, no masses, no thyromegaly Respiratory: clear to auscultation bilaterally, no wheezing, no crackles. Normal respiratory effort. No accessory muscle use.  Cardiovascular: Regular rate and rhythm, no murmurs / rubs / gallops. No extremity edema. 2+ pedal pulses. No carotid bruits.  Abdomen: no tenderness, no masses palpated. No hepatosplenomegaly. Bowel sounds positive.  Musculoskeletal: no clubbing / cyanosis. No joint deformity upper and lower extremities. Good ROM, no contractures. Normal muscle tone.  Skin: no rashes, lesions, ulcers. No induration Neurologic: CN 2-12 grossly intact.  Sensation intact, DTR normal. Strength 5/5 in all 4.  Psychiatric: Normal judgment and insight. Alert and oriented x 3. Normal mood.    Labs on Admission: I have personally reviewed following labs and imaging studies  CBC: No results for input(s): WBC, NEUTROABS, HGB, HCT, MCV, PLT in the last 168 hours. Basic Metabolic Panel: No results for input(s): NA, K, CL, CO2, GLUCOSE, BUN, CREATININE, CALCIUM, MG, PHOS in the last 168 hours. GFR: CrCl cannot be calculated (Patient has no serum creatinine result on file.). Liver Function Tests: No results for input(s): AST, ALT, ALKPHOS, BILITOT, PROT, ALBUMIN in the last 168 hours. No results for input(s): LIPASE, AMYLASE in the last 168 hours. No results for input(s): AMMONIA in the last 168 hours. Coagulation Profile: No results for input(s): INR, PROTIME in the last 168 hours. Cardiac Enzymes: No results for input(s): CKTOTAL, CKMB, CKMBINDEX, TROPONINI in the last 168 hours. BNP (last 3 results) No results for input(s): PROBNP in the last 8760 hours. HbA1C: No results for input(s): HGBA1C in the last 72 hours. CBG: No results for input(s): GLUCAP in the last 168 hours. Lipid Profile: No results for input(s): CHOL, HDL, LDLCALC, TRIG, CHOLHDL, LDLDIRECT in the last 72 hours. Thyroid Function Tests: No results for input(s): TSH, T4TOTAL, FREET4, T3FREE, THYROIDAB in the last 72 hours. Anemia Panel: No results for input(s): VITAMINB12, FOLATE, FERRITIN, TIBC, IRON, RETICCTPCT in the last 72 hours. Urine analysis: No results found for: COLORURINE, APPEARANCEUR, LABSPEC, PHURINE, GLUCOSEU, HGBUR, BILIRUBINUR, KETONESUR, PROTEINUR, UROBILINOGEN, NITRITE, LEUKOCYTESUR Sepsis Labs: @LABRCNTIP (procalcitonin:4,lacticidven:4) )No results found for this or any previous visit (from the past 240 hour(s)).   Radiological Exams on Admission: No results found.  EKG: Independently reviewed.  Assessment/Plan Principal Problem:   Brain  mass Active Problems:   Alcohol abuse   CORONARY ATHEROSCLEROSIS NATIVE CORONARY ARTERY   HTN (hypertension)   Brain mass - Dr. Arnoldo Morale feels that mass is more likely than abscess although it looks like abscess on MRI.  He is at bedside at the time I am seeing patient.  He recommends NPO after breakfast tomorrow, plans surgery in evening  He recommends no antibiotics at this point  He recommends no steroids at this point  He does recommend loading patient with keppra due to fact that syncope episode may have been related to seizure.  Have ordered 1gm load IV then 500mg  IV BID keppra.  He recommends routine lab work and HIV screen on patient (have ordered this)  He recommends no anticoagulants (due to fact patient going to OR tomorrow).  H/o EtOH abuse - patient on CIWA  H/o CAD -  Hasnt been taking meds recently  Did have negative stress test at [facility] today based on their transfer documentation (EF 51%), so should be clear from a  cardiac perspective for surgery (which isnt really elective anyhow).  Will resume statin  Will hold off on ASA / plavix (due to surgery planned for tomorrow).  HTN - not currently taking home meds, will hold off on starting any new meds at the moment   DVT prophylaxis: SCDs only due to planned surgery Code Status: Full Family Communication: No family in room Consults called: Dr. Arnoldo Morale is at bedside Admission status: Admit to inpatient   Etta Quill DO [facility] Pager [phone] from 7PM-7AM  If 7AM-7PM, please contact the day physician for the patient [email or web address] Password [facility]  12/30/2015, 7:34 PM

## 2015-12-31 ENCOUNTER — Inpatient Hospital Stay (HOSPITAL_COMMUNITY): Payer: Self-pay

## 2015-12-31 ENCOUNTER — Inpatient Hospital Stay (HOSPITAL_COMMUNITY): Payer: Self-pay | Admitting: Anesthesiology

## 2015-12-31 ENCOUNTER — Encounter (HOSPITAL_COMMUNITY)
Admission: AD | Disposition: A | Discharge status: Skilled Nursing Facility | Payer: Self-pay | Source: Other Acute Inpatient Hospital | Attending: Neurosurgery

## 2015-12-31 ENCOUNTER — Inpatient Hospital Stay (HOSPITAL_COMMUNITY): Payer: MEDICAID | Admitting: Anesthesiology

## 2015-12-31 DIAGNOSIS — D496 Neoplasm of unspecified behavior of brain: Secondary | ICD-10-CM | POA: Insufficient documentation

## 2015-12-31 HISTORY — PX: CRANIOTOMY: SHX93

## 2015-12-31 LAB — GRAM STAIN

## 2015-12-31 LAB — BASIC METABOLIC PANEL
Anion gap: 11 (ref 5–15)
BUN: 7 mg/dL (ref 6–20)
CHLORIDE: 102 mmol/L (ref 101–111)
CO2: 25 mmol/L (ref 22–32)
Calcium: 9.2 mg/dL (ref 8.9–10.3)
Creatinine, Ser: 0.75 mg/dL (ref 0.61–1.24)
GFR calc Af Amer: >60 mL/min (ref >60)
GLUCOSE: 96 mg/dL (ref 65–99)
POTASSIUM: 3.8 mmol/L (ref 3.5–5.1)
Sodium: 138 mmol/L (ref 135–145)

## 2015-12-31 LAB — CBC
HEMATOCRIT: 44.9 % (ref 39.0–52.0)
Hemoglobin: 14.7 g/dL (ref 13.0–17.0)
MCH: 31.4 pg (ref 26.0–34.0)
MCHC: 32.7 g/dL (ref 30.0–36.0)
MCV: 95.9 fL (ref 78.0–100.0)
PLATELETS: 299 10*3/uL (ref 150–400)
RBC: 4.68 MIL/uL (ref 4.22–5.81)
RDW: 14.3 % (ref 11.5–15.5)
WBC: 11.2 10*3/uL — ABNORMAL HIGH (ref 4.0–10.5)

## 2015-12-31 LAB — HIV ANTIBODY (ROUTINE TESTING W REFLEX): HIV SCREEN 4TH GENERATION: NONREACTIVE

## 2015-12-31 SURGERY — CRANIOTOMY, FOR ABSCESS DRAINAGE
Anesthesia: General | Site: Head | Laterality: Left

## 2015-12-31 MED ORDER — CEFAZOLIN SODIUM-DEXTROSE 2-4 GM/100ML-% IV SOLN
INTRAVENOUS | Status: AC
  Administered 2015-12-31: 2 g via INTRAVENOUS
  Filled 2015-12-31: qty 100

## 2015-12-31 MED ORDER — HEMOSTATIC AGENTS (NO CHARGE) OPTIME
TOPICAL | Status: DC | PRN
  Administered 2015-12-31: 1 via TOPICAL

## 2015-12-31 MED ORDER — SODIUM CHLORIDE 0.9 % IV SOLN
INTRAVENOUS | Status: DC | PRN
  Administered 2015-12-31: 15:00:00 via INTRAVENOUS

## 2015-12-31 MED ORDER — DEXTROSE 5 % IV SOLN
2.0000 g | INTRAVENOUS | Status: DC
  Administered 2015-12-31: 2 g via INTRAVENOUS
  Filled 2015-12-31: qty 2

## 2015-12-31 MED ORDER — MIDAZOLAM HCL 2 MG/2ML IJ SOLN
INTRAMUSCULAR | Status: AC
  Filled 2015-12-31: qty 2

## 2015-12-31 MED ORDER — SODIUM CHLORIDE 0.9 % IV SOLN: 500.0000 mg | Freq: Two times a day (BID) | INTRAVENOUS | Status: DC

## 2015-12-31 MED ORDER — ROCURONIUM BROMIDE 100 MG/10ML IV SOLN
INTRAVENOUS | Status: DC | PRN
  Administered 2015-12-31: 10 mg via INTRAVENOUS
  Administered 2015-12-31: 50 mg via INTRAVENOUS

## 2015-12-31 MED ORDER — PHENYLEPHRINE HCL 10 MG/ML IJ SOLN
10.0000 mg | INTRAVENOUS | Status: DC | PRN
  Administered 2015-12-31: 40 ug/min via INTRAVENOUS

## 2015-12-31 MED ORDER — THROMBIN 5000 UNITS EX SOLR
OROMUCOSAL | Status: DC | PRN
  Administered 2015-12-31: 16:00:00 via TOPICAL

## 2015-12-31 MED ORDER — EPHEDRINE SULFATE 50 MG/ML IJ SOLN
INTRAMUSCULAR | Status: DC | PRN
  Administered 2015-12-31: 10 mg via INTRAVENOUS
  Administered 2015-12-31: 15 mg via INTRAVENOUS
  Administered 2015-12-31: 10 mg via INTRAVENOUS

## 2015-12-31 MED ORDER — BUPIVACAINE-EPINEPHRINE 0.5% -1:200000 IJ SOLN
INTRAMUSCULAR | Status: DC | PRN
  Administered 2015-12-31: 7 mL

## 2015-12-31 MED ORDER — BISACODYL 10 MG RE SUPP: 10.0000 mg | Freq: Every day | RECTAL | Status: DC | PRN

## 2015-12-31 MED ORDER — SODIUM CHLORIDE 0.9 % IV SOLN
INTRAVENOUS | Status: DC | PRN
  Administered 2015-12-31 (×2): via INTRAVENOUS

## 2015-12-31 MED ORDER — ONDANSETRON HCL 4 MG/2ML IJ SOLN
INTRAMUSCULAR | Status: DC | PRN
  Administered 2015-12-31: 4 mg via INTRAVENOUS

## 2015-12-31 MED ORDER — LIDOCAINE HCL (CARDIAC) 20 MG/ML IV SOLN
INTRAVENOUS | Status: DC | PRN
  Administered 2015-12-31: 60 mg via INTRAVENOUS

## 2015-12-31 MED ORDER — MORPHINE SULFATE (PF) 2 MG/ML IV SOLN
1.0000 mg | INTRAVENOUS | Status: DC | PRN
  Administered 2016-01-01 – 2016-01-02 (×4): 2 mg via INTRAVENOUS
  Administered 2016-01-03: 1 mg via INTRAVENOUS
  Administered 2016-01-04: 2 mg via INTRAVENOUS
  Filled 2015-12-31 (×7): qty 1

## 2015-12-31 MED ORDER — HYDROMORPHONE HCL 1 MG/ML IJ SOLN
INTRAMUSCULAR | Status: AC
  Filled 2015-12-31: qty 1

## 2015-12-31 MED ORDER — POTASSIUM CHLORIDE IN NACL 20-0.9 MEQ/L-% IV SOLN
INTRAVENOUS | Status: DC
  Administered 2015-12-31 – 2016-01-02 (×3): via INTRAVENOUS
  Filled 2015-12-31 (×9): qty 1000

## 2015-12-31 MED ORDER — 0.9 % SODIUM CHLORIDE (POUR BTL) OPTIME
TOPICAL | Status: DC | PRN
  Administered 2015-12-31 (×2): 1000 mL

## 2015-12-31 MED ORDER — ONDANSETRON HCL 4 MG/2ML IJ SOLN: 4.0000 mg | INTRAMUSCULAR | Status: DC | PRN

## 2015-12-31 MED ORDER — DOCUSATE SODIUM 100 MG PO CAPS
100.0000 mg | ORAL_CAPSULE | Freq: Two times a day (BID) | ORAL | Status: DC
  Administered 2015-12-31 – 2016-01-07 (×12): 100 mg via ORAL
  Filled 2015-12-31 (×13): qty 1

## 2015-12-31 MED ORDER — FENTANYL CITRATE (PF) 100 MCG/2ML IJ SOLN
INTRAMUSCULAR | Status: DC | PRN
  Administered 2015-12-31: 150 ug via INTRAVENOUS

## 2015-12-31 MED ORDER — ARTIFICIAL TEARS OP OINT
TOPICAL_OINTMENT | OPHTHALMIC | Status: DC | PRN
  Administered 2015-12-31: 1 via OPHTHALMIC

## 2015-12-31 MED ORDER — FENTANYL CITRATE (PF) 250 MCG/5ML IJ SOLN
INTRAMUSCULAR | Status: AC
  Filled 2015-12-31: qty 5

## 2015-12-31 MED ORDER — HYDROMORPHONE HCL 1 MG/ML IJ SOLN
0.2500 mg | INTRAMUSCULAR | Status: DC | PRN
  Administered 2015-12-31: 0.25 mg via INTRAVENOUS

## 2015-12-31 MED ORDER — ONDANSETRON HCL 4 MG/2ML IJ SOLN
INTRAMUSCULAR | Status: AC
  Filled 2015-12-31: qty 2

## 2015-12-31 MED ORDER — SURGIFOAM 100 EX MISC
CUTANEOUS | Status: DC | PRN
  Administered 2015-12-31: 16:00:00 via TOPICAL

## 2015-12-31 MED ORDER — MANNITOL 25 % IV SOLN
INTRAVENOUS | Status: DC | PRN
  Administered 2015-12-31: 12.5 g via INTRAVENOUS

## 2015-12-31 MED ORDER — SUGAMMADEX SODIUM 500 MG/5ML IV SOLN
INTRAVENOUS | Status: DC | PRN
Start: 2015-12-31 — End: 2015-12-31
  Administered 2015-12-31: 145.8 mg via INTRAVENOUS

## 2015-12-31 MED ORDER — CEFTRIAXONE SODIUM 2 G IJ SOLR
2.0000 g | Freq: Two times a day (BID) | INTRAMUSCULAR | Status: DC
  Administered 2016-01-01 – 2016-01-07 (×12): 2 g via INTRAVENOUS
  Filled 2015-12-31 (×14): qty 2

## 2015-12-31 MED ORDER — LABETALOL HCL 5 MG/ML IV SOLN
INTRAVENOUS | Status: DC | PRN
Start: 2015-12-31 — End: 2015-12-31
  Administered 2015-12-31 (×2): 5 mg via INTRAVENOUS

## 2015-12-31 MED ORDER — ACETAMINOPHEN 650 MG RE SUPP: 650.0000 mg | RECTAL | Status: DC | PRN

## 2015-12-31 MED ORDER — PANTOPRAZOLE SODIUM 40 MG IV SOLR
40.0000 mg | Freq: Every day | INTRAVENOUS | Status: DC
  Administered 2015-12-31 – 2016-01-02 (×3): 40 mg via INTRAVENOUS
  Filled 2015-12-31 (×3): qty 40

## 2015-12-31 MED ORDER — ROCURONIUM BROMIDE 50 MG/5ML IV SOLN
INTRAVENOUS | Status: AC
  Filled 2015-12-31: qty 2

## 2015-12-31 MED ORDER — HYDROCODONE-ACETAMINOPHEN 5-325 MG PO TABS
1.0000 | ORAL_TABLET | ORAL | Status: DC | PRN
  Administered 2015-12-31 – 2016-01-07 (×13): 1 via ORAL
  Filled 2015-12-31 (×13): qty 1

## 2015-12-31 MED ORDER — LABETALOL HCL 5 MG/ML IV SOLN
INTRAVENOUS | Status: AC
  Filled 2015-12-31: qty 4

## 2015-12-31 MED ORDER — OXYCODONE HCL 5 MG/5ML PO SOLN: 5.0000 mg | Freq: Once | ORAL | Status: DC | PRN

## 2015-12-31 MED ORDER — ONDANSETRON HCL 4 MG/2ML IJ SOLN: 4.0000 mg | Freq: Four times a day (QID) | INTRAMUSCULAR | Status: DC | PRN

## 2015-12-31 MED ORDER — PROMETHAZINE HCL 25 MG PO TABS: 12.5000 mg | ORAL_TABLET | ORAL | Status: DC | PRN

## 2015-12-31 MED ORDER — BACITRACIN 50000 UNITS IM SOLR
INTRAMUSCULAR | Status: DC | PRN
  Administered 2015-12-31: 16:00:00

## 2015-12-31 MED ORDER — PROPOFOL 10 MG/ML IV BOLUS
INTRAVENOUS | Status: AC
  Filled 2015-12-31: qty 20

## 2015-12-31 MED ORDER — VANCOMYCIN HCL IN DEXTROSE 1-5 GM/200ML-% IV SOLN
1000.0000 mg | Freq: Three times a day (TID) | INTRAVENOUS | Status: DC
  Administered 2016-01-01 – 2016-01-02 (×4): 1000 mg via INTRAVENOUS
  Filled 2015-12-31 (×6): qty 200

## 2015-12-31 MED ORDER — OXYCODONE HCL 5 MG PO TABS: 5.0000 mg | ORAL_TABLET | Freq: Once | ORAL | Status: DC | PRN

## 2015-12-31 MED ORDER — PROPOFOL 10 MG/ML IV BOLUS
INTRAVENOUS | Status: DC | PRN
  Administered 2015-12-31: 200 mg via INTRAVENOUS

## 2015-12-31 MED ORDER — ONDANSETRON HCL 4 MG PO TABS: 4.0000 mg | ORAL_TABLET | ORAL | Status: DC | PRN

## 2015-12-31 MED ORDER — LABETALOL HCL 5 MG/ML IV SOLN
10.0000 mg | INTRAVENOUS | Status: DC | PRN
  Administered 2015-12-31: 10 mg via INTRAVENOUS

## 2015-12-31 MED ORDER — ACETAMINOPHEN 325 MG PO TABS
650.0000 mg | ORAL_TABLET | ORAL | Status: DC | PRN
  Administered 2016-01-04: 650 mg via ORAL
  Filled 2015-12-31: qty 2

## 2015-12-31 MED ORDER — PHENYLEPHRINE HCL 10 MG/ML IJ SOLN
INTRAMUSCULAR | Status: DC | PRN
  Administered 2015-12-31: 60 ug via INTRAVENOUS

## 2015-12-31 MED ORDER — MIDAZOLAM HCL 5 MG/5ML IJ SOLN
INTRAMUSCULAR | Status: DC | PRN
  Administered 2015-12-31: 2 mg via INTRAVENOUS

## 2015-12-31 MED ORDER — PHENYLEPHRINE 40 MCG/ML (10ML) SYRINGE FOR IV PUSH (FOR BLOOD PRESSURE SUPPORT)
PREFILLED_SYRINGE | INTRAVENOUS | Status: AC
  Filled 2015-12-31: qty 40

## 2015-12-31 MED ORDER — GADOBENATE DIMEGLUMINE 529 MG/ML IV SOLN
15.0000 mL | Freq: Once | INTRAVENOUS | Status: AC | PRN
  Administered 2015-12-31: 15 mL via INTRAVENOUS

## 2015-12-31 MED ORDER — LIDOCAINE 2% (20 MG/ML) 5 ML SYRINGE
INTRAMUSCULAR | Status: AC
  Filled 2015-12-31: qty 5

## 2015-12-31 MED ORDER — NEOSTIGMINE METHYLSULFATE 5 MG/5ML IV SOSY
PREFILLED_SYRINGE | INTRAVENOUS | Status: AC
  Filled 2015-12-31: qty 5

## 2015-12-31 MED ORDER — SODIUM CHLORIDE 0.9 % IV SOLN
1500.0000 mg | Freq: Once | INTRAVENOUS | Status: AC
  Administered 2015-12-31: 1500 mg via INTRAVENOUS
  Filled 2015-12-31: qty 1500

## 2015-12-31 MED ORDER — GLYCOPYRROLATE 0.2 MG/ML IV SOSY
PREFILLED_SYRINGE | INTRAVENOUS | Status: AC
  Filled 2015-12-31: qty 3

## 2015-12-31 SURGICAL SUPPLY — 75 items
BAG DECANTER FOR FLEXI CONT (MISCELLANEOUS) ×2 IMPLANT
BIT DRILL WIRE PASS 1.3MM (BIT) IMPLANT
BLADE CLIPPER SPEC (BLADE) IMPLANT
BLADE ULTRA TIP 2M (BLADE) IMPLANT
BRUSH SCRUB EZ PLAIN DRY (MISCELLANEOUS) ×2 IMPLANT
BUR ACORN 6.0 PRECISION (BURR) ×2 IMPLANT
BUR ROUND FLUTED (BURR) ×2 IMPLANT
BUR SPIRAL ROUTER 2.3 (BUR) ×2 IMPLANT
CANISTER SUCT 3000ML PPV (MISCELLANEOUS) ×4 IMPLANT
CATH VENTRIC 35X38 W/TROCAR LG (CATHETERS) IMPLANT
CLIP TI MEDIUM 6 (CLIP) IMPLANT
CONT SPEC 4OZ CLIKSEAL STRL BL (MISCELLANEOUS) ×4 IMPLANT
COVER BACK TABLE 60X90IN (DRAPES) IMPLANT
DRAIN SNY WOU 7FLT (WOUND CARE) IMPLANT
DRAPE MICROSCOPE LEICA (MISCELLANEOUS) IMPLANT
DRAPE NEUROLOGICAL W/INCISE (DRAPES) ×2 IMPLANT
DRAPE SURG 17X23 STRL (DRAPES) IMPLANT
DRAPE WARM FLUID 44X44 (DRAPE) ×2 IMPLANT
DRILL WIRE PASS 1.3MM (BIT)
ELECT REM PT RETURN 9FT ADLT (ELECTROSURGICAL) ×2
ELECTRODE REM PT RTRN 9FT ADLT (ELECTROSURGICAL) ×1 IMPLANT
EVACUATOR 1/8 PVC DRAIN (DRAIN) IMPLANT
EVACUATOR SILICONE 100CC (DRAIN) IMPLANT
GAUZE SPONGE 4X4 12PLY STRL (GAUZE/BANDAGES/DRESSINGS) ×2 IMPLANT
GAUZE SPONGE 4X4 16PLY XRAY LF (GAUZE/BANDAGES/DRESSINGS) IMPLANT
GLOVE BIO SURGEON STRL SZ8 (GLOVE) ×4 IMPLANT
GLOVE BIO SURGEON STRL SZ8.5 (GLOVE) ×4 IMPLANT
GOWN STRL REUS W/ TWL LRG LVL3 (GOWN DISPOSABLE) ×2 IMPLANT
GOWN STRL REUS W/ TWL XL LVL3 (GOWN DISPOSABLE) ×1 IMPLANT
GOWN STRL REUS W/TWL LRG LVL3 (GOWN DISPOSABLE) ×2
GOWN STRL REUS W/TWL XL LVL3 (GOWN DISPOSABLE) ×1
HEMOSTAT SURGICEL 2X14 (HEMOSTASIS) ×2 IMPLANT
KIT BASIN OR (CUSTOM PROCEDURE TRAY) ×2 IMPLANT
KIT CLIP RANEY GUN (KITS) ×2 IMPLANT
KIT DRAIN CSF ACCUDRAIN (MISCELLANEOUS) IMPLANT
KIT ROOM TURNOVER OR (KITS) ×2 IMPLANT
MARKER SKIN DUAL TIP RULER LAB (MISCELLANEOUS) IMPLANT
NEEDLE HYPO 22GX1.5 SAFETY (NEEDLE) ×2 IMPLANT
NS IRRIG 1000ML POUR BTL (IV SOLUTION) ×4 IMPLANT
PACK CRANIOTOMY (CUSTOM PROCEDURE TRAY) ×2 IMPLANT
PAD ARMBOARD 7.5X6 YLW CONV (MISCELLANEOUS) ×2 IMPLANT
PATTIES SURGICAL .25X.25 (GAUZE/BANDAGES/DRESSINGS) IMPLANT
PATTIES SURGICAL .5 X.5 (GAUZE/BANDAGES/DRESSINGS) IMPLANT
PATTIES SURGICAL .5 X3 (DISPOSABLE) IMPLANT
PATTIES SURGICAL 1X1 (DISPOSABLE) IMPLANT
PIN MAYFIELD SKULL DISP (PIN) IMPLANT
PLATE 1.5  2HOLE LNG NEURO (Plate) ×3 IMPLANT
PLATE 1.5 2HOLE LNG NEURO (Plate) ×3 IMPLANT
RUBBERBAND STERILE (MISCELLANEOUS) ×4 IMPLANT
SCREW SELF DRILL HT 1.5/4MM (Screw) ×12 IMPLANT
SPECIMEN JAR SMALL (MISCELLANEOUS) IMPLANT
SPONGE NEURO XRAY DETECT 1X3 (DISPOSABLE) IMPLANT
SPONGE SURGIFOAM ABS GEL 100 (HEMOSTASIS) ×2 IMPLANT
STAPLER SKIN PROX WIDE 3.9 (STAPLE) ×2 IMPLANT
STOCKINETTE 6  STRL (DRAPES)
STOCKINETTE 6 STRL (DRAPES) IMPLANT
SUT ETHILON 3 0 FSL (SUTURE) IMPLANT
SUT ETHILON 3 0 PS 1 (SUTURE) IMPLANT
SUT NURALON 4 0 TR CR/8 (SUTURE) ×2 IMPLANT
SUT PROLENE 6 0 BV (SUTURE) IMPLANT
SUT SILK 0 TIES 10X30 (SUTURE) IMPLANT
SUT VIC AB 2-0 CP2 18 (SUTURE) ×2 IMPLANT
SUT VIC AB 3-0 FS2 27 (SUTURE) IMPLANT
SUT VICRYL 4-0 PS2 18IN ABS (SUTURE) IMPLANT
SWAB COLLECTION DEVICE MRSA (MISCELLANEOUS) ×2 IMPLANT
SWAB CULTURE ESWAB REG 1ML (MISCELLANEOUS) ×2 IMPLANT
SYR CONTROL 10ML LL (SYRINGE) ×2 IMPLANT
TAPE CLOTH SURG 4X10 WHT LF (GAUZE/BANDAGES/DRESSINGS) ×2 IMPLANT
TIP NONSTICK .5MMX23CM (INSTRUMENTS) ×1
TIP NONSTICK .5X23 (INSTRUMENTS) ×1 IMPLANT
TOWEL OR 17X24 6PK STRL BLUE (TOWEL DISPOSABLE) ×2 IMPLANT
TOWEL OR 17X26 10 PK STRL BLUE (TOWEL DISPOSABLE) ×2 IMPLANT
TUBE CONNECTING 12X1/4 (SUCTIONS) IMPLANT
UNDERPAD 30X30 INCONTINENT (UNDERPADS AND DIAPERS) IMPLANT
WATER STERILE IRR 1000ML POUR (IV SOLUTION) ×2 IMPLANT

## 2015-12-31 NOTE — Progress Notes (Signed)
[facility] Progress Note Patient Name: Sheryl Jung DOB: Nov 21, 1965 MRN: [id number]   Date of Service  12/31/2015  HPI/Events of Note  S/P crani.  New patient evaluation.  eICU Interventions  Nothing to add.        Kendel Bessey 12/31/2015, 8:33 PM

## 2015-12-31 NOTE — Progress Notes (Signed)
Patient ID: Jeffery Prince, male   DOB: 07-24-1966, 50 y.o.   MRN: [id number] Subjective:  The patient is alert and pleasant and in no apparent distress.  Objective: Vital signs in last 24 hours: Temp:  [97.5 F (36.4 C)-98.2 F (36.8 C)] 97.9 F (36.6 C) (05/16 1634) Pulse Rate:  [65-79] 79 (05/16 1645) Resp:  [11-22] 12 (05/16 1645) BP: (114-143)/(65-102) 123/102 mmHg (05/16 1225) SpO2:  [94 %-100 %] 100 % (05/16 1645) Arterial Line BP: (167)/(82) 167/82 mmHg (05/16 1645) Weight:  [72.9 kg (160 lb 11.5 oz)] 72.9 kg (160 lb 11.5 oz) (05/15 1753)  Intake/Output from previous day: 05/15 0701 - 05/16 0700 In: 240 [P.O.:240] Out: 550 [Urine:550] Intake/Output this shift: Total I/O In: 1425 [P.O.:120; I.V.:1200; IV Piggyback:105] Out: 900 [Urine:850; Blood:50]  Physical exam the patient is alert and pleasant. He is moving all 4 extremities. His speech is normal.  Lab Results:  Recent Labs  12/30/15 2009 12/31/15 0433  WBC 11.7* 11.2*  HGB 14.4 14.7  HCT 43.4 44.9  PLT 326 299   BMET  Recent Labs  12/30/15 2009 12/31/15 0433  NA 136 138  K 3.6 3.8  CL 105 102  CO2 23 25  GLUCOSE 96 96  BUN 7 7  CREATININE 0.53* 0.75  CALCIUM 9.0 9.2    Studies/Results: Mr Kizzie Fantasia Contrast  12/31/2015  CLINICAL DATA:  Syncopal episode today. Follow-up brain tumor versus abscess. EXAM: MRI HEAD WITHOUT AND WITH CONTRAST TECHNIQUE: Multiplanar, multiecho pulse sequences of the brain and surrounding structures were obtained without and with intravenous contrast. CONTRAST:  72mL MULTIHANCE GADOBENATE DIMEGLUMINE 529 MG/ML IV SOLN COMPARISON:  MRI of the head May 01, 2016 FINDINGS: INTRACRANIAL CONTENTS: Stable 27 x 23 x 34 mm LEFT frontal lobe cystic mass with extremely bright reduced diffusion, low ADC value and marked surrounding FLAIR T2 hyperintense vasogenic edema. Irregular marginal enhancement with multiple contiguous satellite foci of cystic reduced diffusion and peripheral  enhancement extending to the dura with focal dural thickening and enhancement. Local sulcal effacement and mass effect without midline shift. Patchy supratentorial white matter FLAIR T2 hyperintensities. Ventricles and sulci are overall normal for patient's age. ORBITS: The included ocular globes and orbital contents are non-suspicious. SINUSES: The mastoid air-cells and included paranasal sinuses are well-aerated. SKULL/SOFT TISSUES: No abnormal sellar expansion. No suspicious calvarial bone marrow signal. Craniocervical junction maintained. 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 Findings are unlikely to represent metastatic disease. Mild chronic small vessel ischemic disease. Electronically Signed   By: Elon Alas M.D.   On: 12/31/2015 02:56    Assessment/Plan: The patient is doing well. I'll ask ID to see him for his brain abscess.  LOS: 1 day     Azrielle Springsteen D 12/31/2015, 4:56 PM

## 2015-12-31 NOTE — Progress Notes (Signed)
[facility] PROGRESS NOTE  Jkobe Conniff [id number] DOB: 1966-06-14 DOA: 12/30/2015   PCP: No primary care provider on file.     Assessment/Plan: Principal Problem:   Brain mass Active Problems:   Alcohol abuse   CORONARY ATHEROSCLEROSIS NATIVE CORONARY ARTERY   HTN (hypertension)   Brain tumor ([place])   Morty Schaal is a 50 y.o. male with medical history significant of CAD s/p stent in 2012, has been off of his meds due to inability to afford for past 5 months. Patient presented to [facility] after an episode of apparent syncope that lasted 10 mins. He was admitted to [facility], serial cardiac enzymes were negative, cardiac stress test was negative. CT scan was performed in the ED at the time of admission and demonstrated apparent L frontal lobe mass. MRI was performed which suggested a ring enhancing abscess vs mass. He was transferred to [facility] for neurosurgical consult.  Assessment and plan  Brain mass - Dr. Arnoldo Morale -abscess versus tumor, needs craniotomy and would  like to proceed with surgery this afternoon Nothing by mouth pending surgery decisions He recommends no antibiotics at this point He recommends no steroids at this point He does recommend loading patient with keppra due to fact that syncope episode may have been related to seizure.  Received 1gm load IV then 500mg  IV BID keppra. HIV antibody pending Holding Lovenox in anticipation for surgery,, will consult pharmacy to start postoperatively  H/o EtOH abuse - patient on CIWA  H/o CAD - Hasnt been taking meds recently Negative stress test at [facility] today based on their transfer documentation (EF 51%), so should be clear from a cardiac perspective for surgery (which isnt really elective anyhow). Will resume statin Will hold off on ASA / plavix (due to surgery planned  for tomorrow).  HTN - not currently taking home meds, will hold off on starting any new meds at the moment   DVT prophylaxsis SCDs  Code Status:  Full code     Family Communication: Discussed in detail with the patient, all imaging results, lab results explained to the patient   Disposition Plan:  Disposition per neurosurgery      Consultants:  Neurosurgery  Procedures:  None  Antibiotics: Anti-infectives    None         HPI/Subjective: Seen Along with family in the room. Denies any headache nausea vomiting blurry vision  Objective: Filed Vitals:   12/30/15 2310 12/31/15 0255 12/31/15 0733 12/31/15 0735  BP: 122/65 116/82  114/79  Pulse: 67 71  65  Temp: 97.9 F (36.6 C) 98 F (36.7 C) 97.5 F (36.4 C)   TempSrc: Oral Oral Oral   Resp: 15 20  11   Height:      Weight:      SpO2: 96% 94%  97%    Intake/Output Summary (Last 24 hours) at 12/31/15 [id number] Last data filed at 12/31/15 0800  Gross per 24 hour  Intake    465 ml  Output    750 ml  Net   -285 ml    Exam:  Examination:  General exam: Appears calm and comfortable  Respiratory system: Clear to auscultation. Respiratory effort normal. Cardiovascular system: S1 & S2 heard, RRR. No JVD, murmurs, rubs, gallops or clicks. No pedal edema. Gastrointestinal system: Abdomen is nondistended, soft and nontender. No organomegaly or masses felt. Normal bowel sounds heard. Central nervous system: Alert and oriented. No focal neurological deficits. Extremities: Symmetric 5 x 5 power. Skin:  No rashes, lesions or ulcers Psychiatry: Judgement and insight appear normal. Mood & affect appropriate.     Data Reviewed: I have personally reviewed following labs and imaging studies  Micro Results Recent Results (from the past 240 hour(s))  MRSA PCR Screening     Status: None   Collection Time: 12/30/15  5:51 PM  Result Value Ref Range Status   MRSA by PCR NEGATIVE NEGATIVE Final    Comment:        The  GeneXpert MRSA Assay (FDA approved for NASAL specimens only), is one component of a comprehensive MRSA colonization surveillance program. It is not intended to diagnose MRSA infection nor to guide or monitor treatment for MRSA infections.     Radiology Reports Mr Jeri Cos Wo Contrast  2016-01-08  CLINICAL DATA:  Syncopal episode today. Follow-up brain tumor versus abscess. EXAM: MRI HEAD WITHOUT AND WITH CONTRAST TECHNIQUE: Multiplanar, multiecho pulse sequences of the brain and surrounding structures were obtained without and with intravenous contrast. CONTRAST:  3mL MULTIHANCE GADOBENATE DIMEGLUMINE 529 MG/ML IV SOLN COMPARISON:  MRI of the head May 01, 2016 FINDINGS: INTRACRANIAL CONTENTS: Stable 27 x 23 x 34 mm LEFT frontal lobe cystic mass with extremely bright reduced diffusion, low ADC value and marked surrounding FLAIR T2 hyperintense vasogenic edema. Irregular marginal enhancement with multiple contiguous satellite foci of cystic reduced diffusion and peripheral enhancement extending to the dura with focal dural thickening and enhancement. Local sulcal effacement and mass effect without midline shift. Patchy supratentorial white matter FLAIR T2 hyperintensities. Ventricles and sulci are overall normal for patient's age. ORBITS: The included ocular globes and orbital contents are non-suspicious. SINUSES: The mastoid air-cells and included paranasal sinuses are well-aerated. SKULL/SOFT TISSUES: No abnormal sellar expansion. No suspicious calvarial bone marrow signal. Craniocervical junction maintained. 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 Findings are unlikely to represent metastatic disease. Mild chronic small vessel ischemic disease. Electronically Signed   By: Elon Alas M.D.   On: 2016/01/08 02:56     CBC  Recent Labs Lab 12/30/15 2009 Jan 08, 2016 0433  WBC 11.7* 11.2*  HGB 14.4 14.7  HCT 43.4  44.9  PLT 326 299  MCV 94.1 95.9  MCH 31.2 31.4  MCHC 33.2 32.7  RDW 14.2 14.3    Chemistries   Recent Labs Lab 12/30/15 2009 2016-01-08 0433  NA 136 138  K 3.6 3.8  CL 105 102  CO2 23 25  GLUCOSE 96 96  BUN 7 7  CREATININE 0.53* 0.75  CALCIUM 9.0 9.2   ------------------------------------------------------------------------------------------------------------------ estimated creatinine clearance is 106.9 mL/min (by C-G formula based on Cr of 0.75). ------------------------------------------------------------------------------------------------------------------ No results for input(s): HGBA1C in the last 72 hours. ------------------------------------------------------------------------------------------------------------------ No results for input(s): CHOL, HDL, LDLCALC, TRIG, CHOLHDL, LDLDIRECT in the last 72 hours. ------------------------------------------------------------------------------------------------------------------ No results for input(s): TSH, T4TOTAL, T3FREE, THYROIDAB in the last 72 hours.  Invalid input(s): FREET3 ------------------------------------------------------------------------------------------------------------------ No results for input(s): VITAMINB12, FOLATE, FERRITIN, TIBC, IRON, RETICCTPCT in the last 72 hours.  Coagulation profile  Recent Labs Lab 12/30/15 2009  INR 1.10    No results for input(s): DDIMER in the last 72 hours.  Cardiac Enzymes No results for input(s): CKMB, TROPONINI, MYOGLOBIN in the last 168 hours.  Invalid input(s): CK ------------------------------------------------------------------------------------------------------------------ Invalid input(s): POCBNP   CBG: No results for input(s): GLUCAP in the last 168 hours.     Studies: Mr Kizzie Fantasia Contrast  Jan 08, 2016  CLINICAL DATA:  Syncopal episode  today. Follow-up brain tumor versus abscess. EXAM: MRI HEAD WITHOUT AND WITH CONTRAST TECHNIQUE:  Multiplanar, multiecho pulse sequences of the brain and surrounding structures were obtained without and with intravenous contrast. CONTRAST:  37mL MULTIHANCE GADOBENATE DIMEGLUMINE 529 MG/ML IV SOLN COMPARISON:  MRI of the head May 01, 2016 FINDINGS: INTRACRANIAL CONTENTS: Stable 27 x 23 x 34 mm LEFT frontal lobe cystic mass with extremely bright reduced diffusion, low ADC value and marked surrounding FLAIR T2 hyperintense vasogenic edema. Irregular marginal enhancement with multiple contiguous satellite foci of cystic reduced diffusion and peripheral enhancement extending to the dura with focal dural thickening and enhancement. Local sulcal effacement and mass effect without midline shift. Patchy supratentorial white matter FLAIR T2 hyperintensities. Ventricles and sulci are overall normal for patient's age. ORBITS: The included ocular globes and orbital contents are non-suspicious. SINUSES: The mastoid air-cells and included paranasal sinuses are well-aerated. SKULL/SOFT TISSUES: No abnormal sellar expansion. No suspicious calvarial bone marrow signal. Craniocervical junction maintained. 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 Findings are unlikely to represent metastatic disease. Mild chronic small vessel ischemic disease. Electronically Signed   By: Elon Alas M.D.   On: 12/31/2015 02:56      Lab Results  Component Value Date   HGBA1C * 07/21/2010    6.0 (NOTE)                                                                       According to the ADA Clinical Practice Recommendations for 2011, when HbA1c is used as a screening test:   >=6.5%   Diagnostic of Diabetes Mellitus           (if abnormal result  is confirmed)  5.7-6.4%   Increased risk of developing Diabetes Mellitus  References:Diagnosis and Classification of Diabetes Mellitus,Diabetes [id number] 1):S62-S69 and Standards of Medical Care in          Diabetes - 2011,Diabetes [id number]  (Suppl 1):S11-S61.   Lab Results  Component Value Date   LDLCALC * 07/22/2010    104        Total Cholesterol/HDL:CHD Risk Coronary Heart Disease Risk Table                     Men   Women  1/2 Average Risk   3.4   3.3  Average Risk       5.0   4.4  2 X Average Risk   9.6   7.1  3 X Average Risk  23.4   11.0        Use the calculated Patient Ratio above and the CHD Risk Table to determine the patient's CHD Risk.        ATP III CLASSIFICATION (LDL):  <100     mg/dL   Optimal  100-129  mg/dL   Near or Above                    Optimal  130-159  mg/dL   Borderline  160-189  mg/dL   High  >190     mg/dL   Very High   CREATININE 0.75 12/31/2015       Scheduled Meds: .  atorvastatin  80 mg Oral q1800  . folic acid  1 mg Oral Daily  . levETIRAcetam  500 mg Intravenous Q12H  . multivitamin with minerals  1 tablet Oral Daily  . pneumococcal 23 valent vaccine  0.5 mL Intramuscular Tomorrow-1000  . thiamine  100 mg Oral Daily   Or  . thiamine  100 mg Intravenous Daily   Continuous Infusions:    LOS: 1 day    Time spent: >30 MINS    [facility]  [facility] Pager [phone]. If 7PM-7AM, please contact night-coverage at [email or web address], password [facility] 12/31/2015, 9:23 AM  LOS: 1 day

## 2015-12-31 NOTE — Transfer of Care (Signed)
Immediate Anesthesia Transfer of Care Note  Patient: Jeffery Prince  Procedure(s) Performed: Procedure(s): CRANIOTOMY INTRACRANIAL ABSCESS DRAINAGE WITH BRAIN LAB (Left)  Patient Location: PACU  Anesthesia Type:General  Level of Consciousness: awake, alert , oriented and patient cooperative  Airway & Oxygen Therapy: Patient Spontanous Breathing and Patient connected to nasal cannula oxygen  Post-op Assessment: Report given to RN, Post -op Vital signs reviewed and stable and Patient moving all extremities  Post vital signs: Reviewed and stable  Last Vitals:  Filed Vitals:   12/31/15 1225 12/31/15 1634  BP: 123/102   Pulse: 67   Temp:  36.6 C  Resp: 22     Last Pain: There were no vitals filed for this visit.       Complications: No apparent anesthesia complications

## 2015-12-31 NOTE — Progress Notes (Signed)
Patient ID: Jeffery Prince, male   DOB: Feb 26, 1966, 50 y.o.   MRN: [id number] Subjective:  The patient is alert and pleasant. He is in no apparent distress.  Objective: Vital signs in last 24 hours: Temp:  [97.5 F (36.4 C)-98.2 F (36.8 C)] 97.5 F (36.4 C) (05/16 0733) Pulse Rate:  [66-71] 71 (05/16 0255) Resp:  [13-20] 20 (05/16 0255) BP: (116-143)/(65-102) 116/82 mmHg (05/16 0255) SpO2:  [94 %-98 %] 94 % (05/16 0255) Weight:  [72.9 kg (160 lb 11.5 oz)] 72.9 kg (160 lb 11.5 oz) (05/15 1753)  Intake/Output from previous day: 05/15 0701 - 05/16 0700 In: 240 [P.O.:240] Out: 550 [Urine:550] Intake/Output this shift: Total I/O In: -  Out: 200 [Urine:200]  Physical exam the patient is alert and oriented. He is moving all 4 extremities well.  Lab Results:  Recent Labs  12/30/15 2009 12/31/15 0433  WBC 11.7* 11.2*  HGB 14.4 14.7  HCT 43.4 44.9  PLT 326 299   BMET  Recent Labs  12/30/15 2009 12/31/15 0433  NA 136 138  K 3.6 3.8  CL 105 102  CO2 23 25  GLUCOSE 96 96  BUN 7 7  CREATININE 0.53* 0.75  CALCIUM 9.0 9.2    Studies/Results: Mr Jeffery Prince Contrast  12/31/2015  CLINICAL DATA:  Syncopal episode today. Follow-up brain tumor versus abscess. EXAM: MRI HEAD WITHOUT AND WITH CONTRAST TECHNIQUE: Multiplanar, multiecho pulse sequences of the brain and surrounding structures were obtained without and with intravenous contrast. CONTRAST:  21mL MULTIHANCE GADOBENATE DIMEGLUMINE 529 MG/ML IV SOLN COMPARISON:  MRI of the head May 01, 2016 FINDINGS: INTRACRANIAL CONTENTS: Stable 27 x 23 x 34 mm LEFT frontal lobe cystic mass with extremely bright reduced diffusion, low ADC value and marked surrounding FLAIR T2 hyperintense vasogenic edema. Irregular marginal enhancement with multiple contiguous satellite foci of cystic reduced diffusion and peripheral enhancement extending to the dura with focal dural thickening and enhancement. Local sulcal effacement and mass effect  without midline shift. Patchy supratentorial white matter FLAIR T2 hyperintensities. Ventricles and sulci are overall normal for patient's age. ORBITS: The included ocular globes and orbital contents are non-suspicious. SINUSES: The mastoid air-cells and included paranasal sinuses are well-aerated. SKULL/SOFT TISSUES: No abnormal sellar expansion. No suspicious calvarial bone marrow signal. Craniocervical junction maintained. 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 Findings are unlikely to represent metastatic disease. Mild chronic small vessel ischemic disease. Electronically Signed   By: Elon Alas M.D.   On: 12/31/2015 02:56    Assessment/Plan: Left brain mass: I have again discussed the situation with the patient. He understands this could be an abscess versus a tumor. I have answered all his questions regarding a craniotomy. He wants to proceed with surgery. We will plan to do this this afternoon.  LOS: 1 day     Jeffery Prince D 12/31/2015, 7:53 AM

## 2015-12-31 NOTE — Anesthesia Procedure Notes (Signed)
Procedure Name: Intubation Date/Time: 12/31/2015 2:59 PM Performed by: Trixie Deis A Pre-anesthesia Checklist: Patient identified, Timeout performed, Emergency Drugs available, Suction available and Patient being monitored Patient Re-evaluated:Patient Re-evaluated prior to inductionOxygen Delivery Method: Circle system utilized Preoxygenation: Pre-oxygenation with 100% oxygen Intubation Type: IV induction Ventilation: Mask ventilation without difficulty Laryngoscope Size: Mac and 4 Grade View: Grade I Tube type: Oral Tube size: 7.5 mm Number of attempts: 1 Airway Equipment and Method: Stylet Placement Confirmation: ETT inserted through vocal cords under direct vision,  breath sounds checked- equal and bilateral and positive ETCO2 Secured at: 23 cm Tube secured with: Tape Dental Injury: Teeth and Oropharynx as per pre-operative assessment

## 2015-12-31 NOTE — Progress Notes (Signed)
Will see patient for brain abscess management. Will get blood cultures to see if any hematogenous risk. Can start on ceftriaxone 2gm IV q 12hr and metronidazole 500mg  IV Q 8hr  Jeffery Prince B. [place] [facility] [phone]

## 2015-12-31 NOTE — Anesthesia Preprocedure Evaluation (Addendum)
Anesthesia Evaluation  Patient identified by MRN, date of birth, ID band Patient awake    Reviewed: Allergy & Precautions, NPO status , Patient's Chart, lab work & pertinent test results  Airway Mallampati: II   Neck ROM: full    Dental   Pulmonary Current Smoker,    breath sounds clear to auscultation       Cardiovascular hypertension, + CAD, + Past MI and + Cardiac Stents   Rhythm:regular Rate:Normal  Recent stress test was negative.   Neuro/Psych Intracranial mass vs. abcess    GI/Hepatic   Endo/Other    Renal/GU      Musculoskeletal   Abdominal   Peds  Hematology   Anesthesia Other Findings   Reproductive/Obstetrics                            Anesthesia Physical Anesthesia Plan  ASA: III  Anesthesia Plan: General   Post-op Pain Management:    Induction: Intravenous  Airway Management Planned: Oral ETT  Additional Equipment: Arterial line  Intra-op Plan:   Post-operative Plan: Extubation in OR  Informed Consent: I have reviewed the patients History and Physical, chart, labs and discussed the procedure including the risks, benefits and alternatives for the proposed anesthesia with the patient or authorized representative who has indicated his/her understanding and acceptance.     Plan Discussed with: CRNA, Anesthesiologist and Surgeon  Anesthesia Plan Comments:         Anesthesia Quick Evaluation

## 2015-12-31 NOTE — Progress Notes (Signed)
Initial Nutrition Assessment  DOCUMENTATION CODES:   Not applicable  INTERVENTION:   -RD will follow for diet advancement and supplement as appropriate  NUTRITION DIAGNOSIS:   Inadequate oral intake related to inability to eat as evidenced by NPO status.  GOAL:   Patient will meet greater than or equal to 90% of their needs  MONITOR:   Diet advancement, Weight trends, Labs, Skin, I & O's  REASON FOR ASSESSMENT:   Malnutrition Screening Tool    ASSESSMENT:   Jeffery Prince is a 50 y.o. male with medical history significant of CAD s/p stent in 2012, has been off of his meds due to inability to afford for past 5 months. Patient presented to [facility] after an episode of apparent syncope that lasted 10 mins. There was also apparent confusion as well.  Pt admitted with with lt frontal lobe mass. Neurosurgery following; pt for craniotomy today.   Pt in OR at time of visit. No family present to provide hx. Unable to complete Nutrition-Focused physical exam at this time.   Per chart review, pt with hx of ETOH abuse and marijuana use.   Wt hx reviewed. Noted pt with distant hx of weight loss per documented wt hx. Noted a 13# (7.5%) wt loss over the past year (per [facility] Everywhere records, wt of 173# at [facility] on 12/11/15).   Case discussed with RN. Pt recently went down for craniotomy. Plan is for pt to transfer to neuro ICU after procedure.   Labs reviewed.   Diet Order:  Diet NPO time specified  Skin:  Reviewed, no issues  Last BM:  PTA  Height:   Ht Readings from Last 1 Encounters:  12/30/15 5\' 8"  (1.727 m)    Weight:   Wt Readings from Last 1 Encounters:  12/30/15 160 lb 11.5 oz (72.9 kg)    Ideal Body Weight:  70 kg  BMI:  Body mass index is 24.44 kg/(m^2).  Estimated Nutritional Needs:   Kcal:  2100-2300  Protein:  110-125 grams  Fluid:  2.1-2.3 L  EDUCATION NEEDS:   No education needs identified at this time  Madelyne Millikan A. Jimmye Norman, RD,  LDN, CDE Pager: [phone] After hours Pager: [phone]

## 2015-12-31 NOTE — Anesthesia Postprocedure Evaluation (Signed)
Anesthesia Post Note  Patient: Jeffery Prince  Procedure(s) Performed: Procedure(s) (LRB): CRANIOTOMY INTRACRANIAL ABSCESS DRAINAGE WITH BRAIN LAB (Left)  Patient location during evaluation: PACU Anesthesia Type: General Level of consciousness: awake and alert and patient cooperative Pain management: pain level controlled Vital Signs Assessment: post-procedure vital signs reviewed and stable Respiratory status: spontaneous breathing and respiratory function stable Cardiovascular status: stable Anesthetic complications: no    Last Vitals:  Filed Vitals:   12/31/15 1715 12/31/15 1730  BP:  136/91  Pulse: 66 72  Temp:    Resp: 13 17    Last Pain: There were no vitals filed for this visit.               [place]

## 2015-12-31 NOTE — Op Note (Signed)
Brief history: The patient is a 50 year old white male who presented to a another hospital with a syncopal episode which likely turned out to be a seizure. He had a cardiac workup which turned up negative. He underwent a brain CT and MRI which demonstrated mass consistent with a left brain abscess. I discussed the situation with the patient. We discussed the various treatment options. He has decided to proceed with surgery after weighing the risks, benefits, and alternatives.  Preop diagnosis: Left frontal brain abscess  Postoperative diagnosis: The same  Procedure: Left frontal craniotomy for evacuation of brain abscess utilizing [place] neuronavigation  Surgeon: Dr. Earle Gell  Assistant: Dr. Kristeen Miss  Anesthesia: Gen. endotracheal  Estimated blood loss: Minimal  Specimens: Cultures  Complications: None  Drains: None  Description of procedure: The patient was brought to the operating room by the anesthesia team. General endotracheal anesthesia was induced. The patient remained in the supine position. I applied the [place] 3 point headrest to the patient's calvarium. We used the laser to register the patient's position to the neuro navigational unit. The patient's left scalp was then shaved with clippers and prepared with beta scrub and Betadine solution. Sterile drapes were applied. I then injected the area to be incised with Marcaine with epinephrine solution. We used the [facility] neuronavigational unit to help [place] determine where to make the incision. I then incised the patient's left frontal scalp with the scalpel. I used Raney clips for wound edge hemostasis. I used a cerebellar retractor for exposure. I then used a high-speed drill to create a burr hole in the left frontal region. We used a foot plate device to create a craniotomy flap. I elevated the craniotomy flap. This exposed the underlying dura. I incised the dura with a 15 blade scalpel and the Metzenbaum scissors. We  elevated the dura. This exposed some obviously abnormal brain. I use electro cautery to create a small corticotomy. We immediately and it into a large abscess cavity. Purulent matter material was discharged. We obtained cultures. I remove the purulent material with suction and irrigation. We then irrigated the cavity with bacitracin solution. I obtained hemostasis in the abscess cavity using bipolar cautery. I lined the cavity with Surgicel. I then reapproximated patient's dura with interrupted 4-0 Nurolon suture. I replaced the craniotomy flap with titanium mini plates and screws. I then removed the cerebellar retractor. I removed the Raney clips. We reapproximated the scalp using interrupted 2-0 Vicryl sutures in the galea. I then reapproximate the skin with stainless steel staples. The wound was then coated with bacitracin ointment. A sterile dressing was applied. The drapes were removed. I removed the [place] 3 point head rest from the patient's calvarium. The patient was subtotally extubated by the anesthesia team and transported to the post anesthesia care unit in stable condition. By report all sponge, instrument, and needle counts were correct at the end this case.

## 2015-12-31 NOTE — Progress Notes (Signed)
Pharmacy Antibiotic Note  Jeffery Prince is a 50 y.o. male here with syncope.  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 He is also noted on ceftriaxone. ID saw and recommends ceftriaxone 2gm IV q12hr and metronidazole -WBC= 11.2, afebrile, SCr= 0.75 and CrCl ~ 100  Plan: -Vancomycin 1500mg  IV xq followed by 1000mg  IV q8h -Will change ceftriaxone to 2gm IV q12h -Consider d/c vancomycin and add metronidazole -Will follow renal function, cultures and clinical progress   Height: 5\' 8"  (172.7 cm) Weight: 160 lb 11.5 oz (72.9 kg) IBW/kg (Calculated) : 68.4  Temp (24hrs), Avg:97.9 F (36.6 C), Min:97.5 F (36.4 C), Max:98.1 F (36.7 C)   Recent Labs Lab 12/30/15 2009 12/31/15 0433  WBC 11.7* 11.2*  CREATININE 0.53* 0.75    Estimated Creatinine Clearance: 106.9 mL/min (by C-G formula based on Cr of 0.75).    No Known Allergies  Antimicrobials this admission: 5/16 Ceftriaxone >> 5/16 vancomycin   Dose adjustments this admission: n/a  Microbiology results: 5/16 blood x2 5/16 wound 5/16 abscess 5/15 MRSA PCR- neg  Thank you for allowing pharmacy to be a part of this patient's care.  Hildred Laser, Pharm D 12/31/2015 9:05 PM

## 2016-01-01 ENCOUNTER — Encounter (HOSPITAL_COMMUNITY): Payer: Self-pay | Admitting: *Deleted

## 2016-01-01 DIAGNOSIS — I2510 Atherosclerotic heart disease of native coronary artery without angina pectoris: Secondary | ICD-10-CM

## 2016-01-01 DIAGNOSIS — K089 Disorder of teeth and supporting structures, unspecified: Secondary | ICD-10-CM

## 2016-01-01 DIAGNOSIS — R4701 Aphasia: Secondary | ICD-10-CM

## 2016-01-01 DIAGNOSIS — F1010 Alcohol abuse, uncomplicated: Secondary | ICD-10-CM

## 2016-01-01 DIAGNOSIS — G939 Disorder of brain, unspecified: Secondary | ICD-10-CM

## 2016-01-01 DIAGNOSIS — G060 Intracranial abscess and granuloma: Principal | ICD-10-CM

## 2016-01-01 LAB — MRSA PCR SCREENING: MRSA BY PCR: NEGATIVE

## 2016-01-01 LAB — CBC
HEMATOCRIT: 41.9 % (ref 39.0–52.0)
HEMOGLOBIN: 14.4 g/dL (ref 13.0–17.0)
MCH: 32.4 pg (ref 26.0–34.0)
MCHC: 34.4 g/dL (ref 30.0–36.0)
MCV: 94.2 fL (ref 78.0–100.0)
Platelets: 319 10*3/uL (ref 150–400)
RBC: 4.45 MIL/uL (ref 4.22–5.81)
RDW: 14.1 % (ref 11.5–15.5)
WBC: 17.1 10*3/uL — AB (ref 4.0–10.5)

## 2016-01-01 LAB — COMPREHENSIVE METABOLIC PANEL
ALK PHOS: 92 U/L (ref 38–126)
ALT: 13 U/L — ABNORMAL LOW (ref 17–63)
ANION GAP: 12 (ref 5–15)
AST: 23 U/L (ref 15–41)
Albumin: 3.3 g/dL — ABNORMAL LOW (ref 3.5–5.0)
BILIRUBIN TOTAL: 2 mg/dL — AB (ref 0.3–1.2)
BUN: 6 mg/dL (ref 6–20)
CALCIUM: 8.7 mg/dL — AB (ref 8.9–10.3)
CO2: 21 mmol/L — ABNORMAL LOW (ref 22–32)
Chloride: 103 mmol/L (ref 101–111)
Creatinine, Ser: 0.64 mg/dL (ref 0.61–1.24)
Glucose, Bld: 110 mg/dL — ABNORMAL HIGH (ref 65–99)
POTASSIUM: 4.5 mmol/L (ref 3.5–5.1)
Sodium: 136 mmol/L (ref 135–145)
TOTAL PROTEIN: 6.9 g/dL (ref 6.5–8.1)

## 2016-01-01 MED ORDER — METRONIDAZOLE IN NACL 5-0.79 MG/ML-% IV SOLN
500.0000 mg | Freq: Three times a day (TID) | INTRAVENOUS | Status: DC
  Administered 2016-01-01 – 2016-01-03 (×6): 500 mg via INTRAVENOUS
  Filled 2016-01-01 (×8): qty 100

## 2016-01-01 MED ORDER — ENSURE ENLIVE PO LIQD
237.0000 mL | Freq: Two times a day (BID) | ORAL | Status: DC
  Administered 2016-01-01 – 2016-01-07 (×8): 237 mL via ORAL
  Filled 2016-01-01 (×10): qty 237

## 2016-01-01 NOTE — Care Management Note (Addendum)
Case Management Note  Patient Details  Name: Jeffery Prince MRN: [id number] Date of Birth: 03-May-1966  Subjective/Objective:  Pt admitted to 53[facility] on 12/31/15 s/p craniotomy for Lt frontal brain abscess.  PTA, pt independent of ADLS.                    Action/Plan: Will follow for discharge planning needs as pt progresses.    Expected Discharge Date:                  Expected Discharge Plan:  Home with home health services In-House Referral:     Discharge planning Services  CM Consult  Post Acute Care Choice:    Choice offered to:     DME Arranged:    DME Agency:     HH Arranged:    HH Agency:     Status of Service:  In process, will continue to follow  Medicare Important Message Given:    Date Medicare IM Given:    Medicare IM give by:    Date Additional Medicare IM Given:    Additional Medicare Important Message give by:     If discussed at [place] of Stay Meetings, dates discussed:    Additional Comments:  Reinaldo Raddle, RN, BSN  Trauma/Neuro ICU Case Manager [phone]

## 2016-01-01 NOTE — Progress Notes (Signed)
Nutrition Follow-up  INTERVENTION:   Ensure Enlive po BID, each supplement provides 350 kcal and 20 grams of protein  NUTRITION DIAGNOSIS:   Inadequate oral intake related to poor appetite as evidenced by meal completion < 50%. Ongoing.   GOAL:   Patient will meet greater than or equal to 90% of their needs Progressing.   MONITOR:   Diet advancement, Weight trends, Labs, Skin, I & O's  ASSESSMENT:   Jeffery Prince is a 50 y.o. male with medical history significant of CAD s/p stent in 2012, has been off of his meds due to inability to afford for past 5 months. Patient presented to [facility] after an episode of apparent syncope that lasted 10 mins. There was also apparent confusion as well.  Per RN pt consumed 75% of his Breakfast and 30% of his lunch. Pt with expressive aphasia and unable to answer questions.  Will add ensure.  Medications reviewed and include: colace, folic acid, thiamine  Diet Order:  Diet regular Room service appropriate?: Yes; Fluid consistency:: Thin  Skin:  Reviewed, no issues  Last BM:  unknown  Height:   Ht Readings from Last 1 Encounters:  12/30/15 5\' 8"  (1.727 m)    Weight:   Wt Readings from Last 1 Encounters:  12/30/15 160 lb 11.5 oz (72.9 kg)    Ideal Body Weight:  70 kg  BMI:  Body mass index is 24.44 kg/(m^2).  Estimated Nutritional Needs:   Kcal:  2100-2300  Protein:  110-125 grams  Fluid:  2.1-2.3 L  EDUCATION NEEDS:   No education needs identified at this time  [place], [place], [place] Pager [phone] After Hours Pager

## 2016-01-01 NOTE — Clinical Documentation Improvement (Signed)
Neuro Surgery  Abnormal diagnostic findings (MRI scans, CT scans, tec.) are not coded and reported unless the physician indicates their clinical significance.  Possible Clinical Conditions:  Cerebral Edema, Cytoxic Edema, Vasogenic Edema, including suspected or known cause and associated condition(s).  Herniation, including type - uncal, transtentorial, or other type of herniation, including suspected or known cause and associated condition(s).  Other  Clinically Undetermined  Document any associated diagnoses/conditions.   Supporting Information: 5/16: MR Brain impression: INTRACRANIAL CONTENTS: Stable 27 x 23 x 34 mm LEFT frontal lobe cystic mass with extremely bright reduced diffusion, low ADC value and marked surrounding FLAIR T2 hyperintense vasogenic edema. Irregular marginal enhancement with multiple contiguous satellite  Please exercise your independent, professional judgment when responding. A specific answer is not anticipated or expected.   Thank You,  [place] [phone]

## 2016-01-01 NOTE — Consult Note (Addendum)
[place] for Infectious Disease  Total days of antibiotics 2        Day 2 vanco/ctx/metronidazole               Reason for Consult: brain absceess    Referring Physician: jenkins  Principal Problem:   Brain mass Active Problems:   Alcohol abuse   CORONARY ATHEROSCLEROSIS NATIVE CORONARY ARTERY   HTN (hypertension)   Brain tumor (HCC)    HPI: Jeffery Prince is a 50 y.o. male with hx of CAD s/p inferior MI requiring DES, HLD, tobacco and alcohol use presented to [facility] with loss of consciousness and work up revealed possible brain mass/tumor vs. Brain abscess. MRI imaging revealed multicystic left frontal intraparenchymal abscess measuring 27 x 23 x34 mm. He was transferred to [facility] to [place] service for management. He has been off of abtx in the meantime to increase yield of tissue biopsy/cx. He went to the OR on 5/16 for left frontal craniotomy with tissue sent for path and culture. He appears stable from the surgery, no neuro deficits, though slow to respond/answer questions. He was empirically started on vancomycin, ceftriaxone and metronidazole.  He denies any difficulty with teeth or recent dental work (though concern he didn't understand question)  Past Medical History  Diagnosis Date  . Ischemic cardiomyopathy     EF: 45-50% post MI  . MI (myocardial infarction) ([place])   . CAD (coronary artery disease) 07/2010    s/p inferior MI  . HLD (hyperlipidemia)   . HTN (hypertension)   . Tobacco abuse     Allergies: No Known Allergies  Current antibiotics:   MEDICATIONS: . atorvastatin  80 mg Oral q1800  . cefTRIAXone (ROCEPHIN)  IV  2 g Intravenous Q12H  . docusate sodium  100 mg Oral BID  . folic acid  1 mg Oral Daily  . levETIRAcetam  500 mg Intravenous Q12H  . metronidazole  500 mg Intravenous Q8H  . multivitamin with minerals  1 tablet Oral Daily  . pantoprazole (PROTONIX) IV  40 mg Intravenous QHS  . thiamine  100 mg Oral Daily   Or  . thiamine  100  mg Intravenous Daily  . vancomycin  1,000 mg Intravenous Q8H    Social History  Substance Use Topics  . Smoking status: Current Every Day Smoker -- 0.50 packs/day  . Smokeless tobacco: None  . Alcohol Use: Yes    History reviewed. No pertinent family history.   Review of Systems  Constitutional: Negative for fever, chills, diaphoresis, activity change, appetite change, fatigue and unexpected weight change.  HENT: Negative for congestion, sore throat, rhinorrhea, sneezing, trouble swallowing and sinus pressure.  Eyes: Negative for photophobia and visual disturbance.  Respiratory: Negative for cough, chest tightness, shortness of breath, wheezing and stridor.  Cardiovascular: Negative for chest pain, palpitations and leg swelling.  Gastrointestinal: Negative for nausea, vomiting, abdominal pain, diarrhea, constipation, blood in stool, abdominal distention and anal bleeding.  Genitourinary: Negative for dysuria, hematuria, flank pain and difficulty urinating.  Musculoskeletal: Negative for myalgias, back pain, joint swelling, arthralgias and gait problem.  Skin: Negative for color change, pallor, rash and wound.  Neurological: +syncope/seizure Negative for dizziness, tremors, weakness and light-headedness.  Hematological: Negative for adenopathy. Does not bruise/bleed easily.  Psychiatric/Behavioral: Negative for behavioral problems, confusion, sleep disturbance, dysphoric mood, decreased concentration and agitation.     OBJECTIVE: Temp:  [97.8 F (36.6 C)-99.6 F (37.6 C)] 98.8 F (37.1 C) (05/17 1200) Pulse Rate:  [60-111] 87 (  05/17 1400) Resp:  [11-34] 29 (05/17 1400) BP: (113-170)/(72-107) 121/73 mmHg (05/17 1400) SpO2:  [95 %-100 %] 100 % (05/17 1400) Arterial Line BP: (131-186)/(63-94) 164/82 mmHg (05/17 0900) Physical Exam  Constitutional: He is oriented to person, place, and time. He appears well-developed and well-nourished. No distress. Bandage to left frontal  area HENT:  Mouth/Throat: Oropharynx is clear and moist. No oropharyngeal exudate.  Cardiovascular: Normal rate, regular rhythm and normal heart sounds. Exam reveals no gallop and no friction rub.  No murmur heard.  Pulmonary/Chest: Effort normal and breath sounds normal. No respiratory distress. He has no wheezes.  Abdominal: Soft. Bowel sounds are normal. He exhibits no distension. There is no tenderness.  Lymphadenopathy:  He has no cervical adenopathy.  Neurological: He is alert and oriented to person, place, and time.  Skin: Skin is warm and dry. No rash noted. No erythema.  Psychiatric: He has a normal mood and affect. His behavior is normal.     LABS: Results for orders placed or performed during the hospital encounter of 12/30/15 (from the past 48 hour(s))  MRSA PCR Screening     Status: None   Collection Time: 12/30/15  5:51 PM  Result Value Ref Range   MRSA by PCR NEGATIVE NEGATIVE    Comment:        The GeneXpert MRSA Assay (FDA approved for NASAL specimens only), is one component of a comprehensive MRSA colonization surveillance program. It is not intended to diagnose MRSA infection nor to guide or monitor treatment for MRSA infections.   Basic metabolic panel     Status: Abnormal   Collection Time: 12/30/15  8:09 PM  Result Value Ref Range   Sodium 136 135 - 145 mmol/L   Potassium 3.6 3.5 - 5.1 mmol/L   Chloride 105 101 - 111 mmol/L   CO2 23 22 - 32 mmol/L   Glucose, Bld 96 65 - 99 mg/dL   BUN 7 6 - 20 mg/dL   Creatinine, Ser 0.53 (L) 0.61 - 1.24 mg/dL   Calcium 9.0 8.9 - 10.3 mg/dL   GFR calc non Af Amer >60 >60 mL/min   GFR calc Af Amer >60 >60 mL/min    Comment: (NOTE) The eGFR has been calculated using the CKD EPI equation. This calculation has not been validated in all clinical situations. eGFR's persistently <60 mL/min signify possible Chronic Kidney Disease.    Anion gap 8 5 - 15  CBC     Status: Abnormal   Collection Time: 12/30/15  8:09 PM   Result Value Ref Range   WBC 11.7 (H) 4.0 - 10.5 K/uL   RBC 4.61 4.22 - 5.81 MIL/uL   Hemoglobin 14.4 13.0 - 17.0 g/dL   HCT 43.4 39.0 - 52.0 %   MCV 94.1 78.0 - 100.0 fL   MCH 31.2 26.0 - 34.0 pg   MCHC 33.2 30.0 - 36.0 g/dL   RDW 14.2 11.5 - 15.5 %   Platelets 326 150 - 400 K/uL  Protime-INR     Status: None   Collection Time: 12/30/15  8:09 PM  Result Value Ref Range   Prothrombin Time 14.4 11.6 - 15.2 seconds   INR 1.10 0.00 - 1.49  HIV antibody     Status: None   Collection Time: 12/30/15  9:02 PM  Result Value Ref Range   HIV Screen 4th Generation wRfx Non Reactive Non Reactive    Comment: (NOTE) Performed At: [facility] [address] [place], [place] [id number] Darrel Hoover  F MD UK:[id number]   CBC     Status: Abnormal   Collection Time: 12/31/15  4:33 AM  Result Value Ref Range   WBC 11.2 (H) 4.0 - 10.5 K/uL   RBC 4.68 4.22 - 5.81 MIL/uL   Hemoglobin 14.7 13.0 - 17.0 g/dL   HCT 44.9 39.0 - 52.0 %   MCV 95.9 78.0 - 100.0 fL   MCH 31.4 26.0 - 34.0 pg   MCHC 32.7 30.0 - 36.0 g/dL   RDW 14.3 11.5 - 15.5 %   Platelets 299 150 - 400 K/uL  Basic metabolic panel     Status: None   Collection Time: 12/31/15  4:33 AM  Result Value Ref Range   Sodium 138 135 - 145 mmol/L   Potassium 3.8 3.5 - 5.1 mmol/L   Chloride 102 101 - 111 mmol/L   CO2 25 22 - 32 mmol/L   Glucose, Bld 96 65 - 99 mg/dL   BUN 7 6 - 20 mg/dL   Creatinine, Ser 0.75 0.61 - 1.24 mg/dL   Calcium 9.2 8.9 - 10.3 mg/dL   GFR calc non Af Amer >60 >60 mL/min   GFR calc Af Amer >60 >60 mL/min    Comment: (NOTE) The eGFR has been calculated using the CKD EPI equation. This calculation has not been validated in all clinical situations. eGFR's persistently <60 mL/min signify possible Chronic Kidney Disease.    Anion gap 11 5 - 15  Gram stain     Status: None   Collection Time: 12/31/15  3:52 PM  Result Value Ref Range   Specimen Description ABSCESS    Special Requests DRAINAGE FROM BRAIN     Gram Stain      FEW WBC PRESENT, PREDOMINANTLY PMN NO ORGANISMS SEEN Gram Stain Report Called to,Read Back By and Verified With:    Report Status 12/31/2015 FINAL   Comprehensive metabolic panel     Status: Abnormal   Collection Time: 01/01/16  6:21 AM  Result Value Ref Range   Sodium 136 135 - 145 mmol/L   Potassium 4.5 3.5 - 5.1 mmol/L    Comment: HEMOLYSIS AT THIS LEVEL MAY AFFECT RESULT   Chloride 103 101 - 111 mmol/L   CO2 21 (L) 22 - 32 mmol/L   Glucose, Bld 110 (H) 65 - 99 mg/dL   BUN 6 6 - 20 mg/dL   Creatinine, Ser 0.64 0.61 - 1.24 mg/dL   Calcium 8.7 (L) 8.9 - 10.3 mg/dL   Total Protein 6.9 6.5 - 8.1 g/dL   Albumin 3.3 (L) 3.5 - 5.0 g/dL   AST 23 15 - 41 U/L   ALT 13 (L) 17 - 63 U/L   Alkaline Phosphatase 92 38 - 126 U/L   Total Bilirubin 2.0 (H) 0.3 - 1.2 mg/dL   GFR calc non Af Amer >60 >60 mL/min   GFR calc Af Amer >60 >60 mL/min    Comment: (NOTE) The eGFR has been calculated using the CKD EPI equation. This calculation has not been validated in all clinical situations. eGFR's persistently <60 mL/min signify possible Chronic Kidney Disease.    Anion gap 12 5 - 15  CBC     Status: Abnormal   Collection Time: 01/01/16  6:21 AM  Result Value Ref Range   WBC 17.1 (H) 4.0 - 10.5 K/uL   RBC 4.45 4.22 - 5.81 MIL/uL   Hemoglobin 14.4 13.0 - 17.0 g/dL   HCT 41.9 39.0 - 52.0 %   MCV 94.2 78.0 -  100.0 fL   MCH 32.4 26.0 - 34.0 pg   MCHC 34.4 30.0 - 36.0 g/dL   RDW 14.1 11.5 - 15.5 %   Platelets 319 150 - 400 K/uL  MRSA PCR Screening     Status: None   Collection Time: 01/01/16 11:44 AM  Result Value Ref Range   MRSA by PCR NEGATIVE NEGATIVE    Comment:        The GeneXpert MRSA Assay (FDA approved for NASAL specimens only), is one component of a comprehensive MRSA colonization surveillance program. It is not intended to diagnose MRSA infection nor to guide or monitor treatment for MRSA infections.     MICRO: 5/16 tissue cx pending 5/16 blood cx  pending  IMAGING: Mr Jeri Cos [place] Contrast  12/31/2015  CLINICAL DATA:  Syncopal episode today. Follow-up brain tumor versus abscess. EXAM: MRI HEAD WITHOUT AND WITH CONTRAST TECHNIQUE: Multiplanar, multiecho pulse sequences of the brain and surrounding structures were obtained without and with intravenous contrast. CONTRAST:  17m MULTIHANCE GADOBENATE DIMEGLUMINE 529 MG/ML IV SOLN COMPARISON:  MRI of the head May 01, 2016 FINDINGS: INTRACRANIAL CONTENTS: Stable 27 x 23 x 34 mm LEFT frontal lobe cystic mass with extremely bright reduced diffusion, low ADC value and marked surrounding FLAIR T2 hyperintense vasogenic edema. Irregular marginal enhancement with multiple contiguous satellite foci of cystic reduced diffusion and peripheral enhancement extending to the dura with focal dural thickening and enhancement. Local sulcal effacement and mass effect without midline shift. Patchy supratentorial white matter FLAIR T2 hyperintensities. Ventricles and sulci are overall normal for patient's age. ORBITS: The included ocular globes and orbital contents are non-suspicious. SINUSES: The mastoid air-cells and included paranasal sinuses are well-aerated. SKULL/SOFT TISSUES: No abnormal sellar expansion. No suspicious calvarial bone marrow signal. Craniocervical junction maintained. 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 Findings are unlikely to represent metastatic disease. Mild chronic small vessel ischemic disease. Electronically Signed   By: CElon AlasM.D.   On: 12/31/2015 02:56   Dg Chest Port 1 View  12/31/2015  CLINICAL DATA:  Status post central line placement EXAM: PORTABLE CHEST 1 VIEW COMPARISON:  12/28/2015 FINDINGS: Cardiac shadow is stable. A new right jugular central line is noted in the proximal superior vena cava. No pneumothorax is noted. No focal infiltrate or sizable effusion is seen. No bony abnormality is noted.  IMPRESSION: No pneumothorax following central line placement. Electronically Signed   By: MInez CatalinaM.D.   On: 12/31/2015 17:20    Assessment/Plan:  89yoM with left frontal brain abscess, currently on empiric regimen  - await culture resutls to see if can narrow spectrum of abtx, for now keep on vanco/ctx/ metronidazole - source is unclear, will get a panorex tomorrow to see if any dental abscess/ caries as possible culprits - if blood cx are NGTD on 5/19, can place picc line for prolonged IV abtx, minimum of 6-8wk, and will need repeat imaging - will check for toxoplasmosis, though feature suggestive more so of brain abscess - health maintenance = will check hep C  - history of alcohol use = will check thiamine levels

## 2016-01-01 NOTE — Progress Notes (Signed)
Patient ID: Jeffery Prince, male   DOB: [birth year], 50 y.o.   MRN: [id number] Vital signs are stable The patient has delayed responses and appears a [name] He is moving all 4 extremities He answers some questions with simple one-word answers The dressing is clean dry on his head We will remove central line and A-line and start to mobilize patient but keep in intensive care unit today

## 2016-01-01 NOTE — Progress Notes (Signed)
[facility] PROGRESS NOTE  Suheyb Verhagen [id number] DOB: May 28, 1966 DOA: 12/30/2015   PCP: No primary care provider on file.     Assessment/Plan: Principal Problem:   Brain mass Active Problems:   Alcohol abuse   CORONARY ATHEROSCLEROSIS NATIVE CORONARY ARTERY   HTN (hypertension)   Brain tumor ([place])   Jeffery Prince is a 50 y.o. male with medical history significant of CAD s/p stent in 2012, has been off of his meds due to inability to afford for past 5 months. Patient presented to [facility] after an episode of apparent syncope that lasted 10 mins. He was admitted to [facility], serial cardiac enzymes were negative, cardiac stress test was negative. CT scan was performed in the ED at the time of admission and demonstrated apparent L frontal lobe mass. MRI was performed which suggested a ring enhancing abscess vs mass. He was transferred to [facility] for neurosurgical consult.  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 Started on vancomycin, ceftriaxone   for brain abscess. Blood cultures drawn 5/16. Status post loading   with keppra due to fact that syncope episode may have been related to seizure.  Received 1gm load IV then 500mg  IV BID keppra.HIV antibody nonreactive.Holding Lovenox in anticipation for surgery,, will consult pharmacy to start postoperatively if okay with neurosurgery. 2-D echo to rule out embolic source for the brain abscess   H/o EtOH abuse - patient on CIWA   H/o CAD - Hasnt been taking meds recently Negative stress test at [facility] today based on their transfer documentation (EF 51%), so should be clear from a cardiac perspective for surgery (which isnt really elective anyhow). Will resume statin Will hold off on ASA / plavix (due to surgery planned for tomorrow).  HTN - not currently taking home meds, will hold off on starting any new  meds at the moment   DVT prophylaxsis SCDs/Lovenox?  Code Status:  Full code     Family Communication: Discussed in detail with the patient, all imaging results, lab results explained to the patient   Disposition Plan:  Disposition per neurosurgery, infectious disease      Consultants:  Neurosurgery  Infectious disease  Procedures: Left frontal craniotomy for evacuation of brain abscess utilizing [facility] neuronavigation  Antibiotics: Rocephin 5/16 Vancomycin 5/16        HPI/Subjective:  Patient extremely confused, unable to give me his date of birth  Objective: Filed Vitals:   01/01/16 0500 01/01/16 0600 01/01/16 0700 01/01/16 0800  BP: 145/96 118/97 135/86 113/83  Pulse: 82 108 103 87  Temp:    97.8 F (36.6 C)  TempSrc:    Axillary  Resp: 26 22 29 25   Height:      Weight:      SpO2: 96% 98% 97% 95%    Intake/Output Summary (Last 24 hours) at 01/01/16 0859 Last data filed at 01/01/16 0800  Gross per 24 hour  Intake   2370 ml  Output   2075 ml  Net    295 ml    Exam:  Examination:  General exam: Appears calm and comfortable  Respiratory system: Clear to auscultation. Respiratory effort normal. Cardiovascular system: S1 & S2 heard, RRR. No JVD, murmurs, rubs, gallops or clicks. No pedal edema. Gastrointestinal system: Abdomen is nondistended, soft and nontender. No organomegaly or masses felt. Normal bowel sounds heard. Central nervous system: Alert and oriented. No focal neurological deficits. Extremities: Symmetric 5 x 5 power.  Skin: No rashes, lesions or ulcers Psychiatry: Judgement and insight appear normal. Mood & affect appropriate.     Data Reviewed: I have personally reviewed following labs and imaging studies  Micro Results Recent Results (from the past 240 hour(s))  MRSA PCR Screening     Status: None   Collection Time: 12/30/15  5:51 PM  Result Value Ref Range Status   MRSA by PCR NEGATIVE NEGATIVE Final    Comment:         The GeneXpert MRSA Assay (FDA approved for NASAL specimens only), is one component of a comprehensive MRSA colonization surveillance program. It is not intended to diagnose MRSA infection nor to guide or monitor treatment for MRSA infections.   Gram stain     Status: None   Collection Time: 12/31/15  3:52 PM  Result Value Ref Range Status   Specimen Description ABSCESS  Final   Special Requests DRAINAGE FROM BRAIN  Final   Gram Stain   Final    FEW WBC PRESENT, PREDOMINANTLY PMN NO ORGANISMS SEEN Gram Stain Report Called to,Read Back By and Verified With:    Report Status 12/31/2015 FINAL  Final    Radiology Reports Mr Kizzie Fantasia Contrast  12/31/2015  CLINICAL DATA:  Syncopal episode today. Follow-up brain tumor versus abscess. EXAM: MRI HEAD WITHOUT AND WITH CONTRAST TECHNIQUE: Multiplanar, multiecho pulse sequences of the brain and surrounding structures were obtained without and with intravenous contrast. CONTRAST:  68mL MULTIHANCE GADOBENATE DIMEGLUMINE 529 MG/ML IV SOLN COMPARISON:  MRI of the head May 01, 2016 FINDINGS: INTRACRANIAL CONTENTS: Stable 27 x 23 x 34 mm LEFT frontal lobe cystic mass with extremely bright reduced diffusion, low ADC value and marked surrounding FLAIR T2 hyperintense vasogenic edema. Irregular marginal enhancement with multiple contiguous satellite foci of cystic reduced diffusion and peripheral enhancement extending to the dura with focal dural thickening and enhancement. Local sulcal effacement and mass effect without midline shift. Patchy supratentorial white matter FLAIR T2 hyperintensities. Ventricles and sulci are overall normal for patient's age. ORBITS: The included ocular globes and orbital contents are non-suspicious. SINUSES: The mastoid air-cells and included paranasal sinuses are well-aerated. SKULL/SOFT TISSUES: No abnormal sellar expansion. No suspicious calvarial bone marrow signal. Craniocervical junction maintained. IMPRESSION: Stable  27 x 23 x 34 mm multi cystic LEFT frontal lobe intraparenchymal abscess extends to the dura. Findings are unlikely to represent metastatic disease. Mild chronic small vessel ischemic disease. Electronically Signed   By: Elon Alas M.D.   On: 12/31/2015 02:56   Dg Chest Port 1 View  12/31/2015  CLINICAL DATA:  Status post central line placement EXAM: PORTABLE CHEST 1 VIEW COMPARISON:  12/28/2015 FINDINGS: Cardiac shadow is stable. A new right jugular central line is noted in the proximal superior vena cava. No pneumothorax is noted. No focal infiltrate or sizable effusion is seen. No bony abnormality is noted. IMPRESSION: No pneumothorax following central line placement. Electronically Signed   By: Inez Catalina M.D.   On: 12/31/2015 17:20     CBC  Recent Labs Lab 12/30/15 2009 12/31/15 0433 01/01/16 0621  WBC 11.7* 11.2* 17.1*  HGB 14.4 14.7 14.4  HCT 43.4 44.9 41.9  PLT 326 299 319  MCV 94.1 95.9 94.2  MCH 31.2 31.4 32.4  MCHC 33.2 32.7 34.4  RDW 14.2 14.3 14.1    Chemistries   Recent Labs Lab 12/30/15 2009 12/31/15 0433 01/01/16 0621  NA 136 138 136  K 3.6 3.8 4.5  CL 105 102 103  CO2  23 25 21*  GLUCOSE 96 96 110*  BUN 7 7 6   CREATININE 0.53* 0.75 0.64  CALCIUM 9.0 9.2 8.7*  AST  --   --  23  ALT  --   --  13*  ALKPHOS  --   --  92  BILITOT  --   --  2.0*   ------------------------------------------------------------------------------------------------------------------ estimated creatinine clearance is 106.9 mL/min (by C-G formula based on Cr of 0.64). ------------------------------------------------------------------------------------------------------------------ No results for input(s): HGBA1C in the last 72 hours. ------------------------------------------------------------------------------------------------------------------ No results for input(s): CHOL, HDL, LDLCALC, TRIG, CHOLHDL, LDLDIRECT in the last 72  hours. ------------------------------------------------------------------------------------------------------------------ No results for input(s): TSH, T4TOTAL, T3FREE, THYROIDAB in the last 72 hours.  Invalid input(s): FREET3 ------------------------------------------------------------------------------------------------------------------ No results for input(s): VITAMINB12, FOLATE, FERRITIN, TIBC, IRON, RETICCTPCT in the last 72 hours.  Coagulation profile  Recent Labs Lab 12/30/15 2009  INR 1.10    No results for input(s): DDIMER in the last 72 hours.  Cardiac Enzymes No results for input(s): CKMB, TROPONINI, MYOGLOBIN in the last 168 hours.  Invalid input(s): CK ------------------------------------------------------------------------------------------------------------------ Invalid input(s): POCBNP   CBG: No results for input(s): GLUCAP in the last 168 hours.     Studies: Mr Kizzie Fantasia Contrast  Jan 11, 2016  CLINICAL DATA:  Syncopal episode today. Follow-up brain tumor versus abscess. EXAM: MRI HEAD WITHOUT AND WITH CONTRAST TECHNIQUE: Multiplanar, multiecho pulse sequences of the brain and surrounding structures were obtained without and with intravenous contrast. CONTRAST:  9mL MULTIHANCE GADOBENATE DIMEGLUMINE 529 MG/ML IV SOLN COMPARISON:  MRI of the head May 01, 2016 FINDINGS: INTRACRANIAL CONTENTS: Stable 27 x 23 x 34 mm LEFT frontal lobe cystic mass with extremely bright reduced diffusion, low ADC value and marked surrounding FLAIR T2 hyperintense vasogenic edema. Irregular marginal enhancement with multiple contiguous satellite foci of cystic reduced diffusion and peripheral enhancement extending to the dura with focal dural thickening and enhancement. Local sulcal effacement and mass effect without midline shift. Patchy supratentorial white matter FLAIR T2 hyperintensities. Ventricles and sulci are overall normal for patient's age. ORBITS: The included ocular  globes and orbital contents are non-suspicious. SINUSES: The mastoid air-cells and included paranasal sinuses are well-aerated. SKULL/SOFT TISSUES: No abnormal sellar expansion. No suspicious calvarial bone marrow signal. Craniocervical junction maintained. 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 Findings are unlikely to represent metastatic disease. Mild chronic small vessel ischemic disease. Electronically Signed   By: Elon Alas M.D.   On: 01-11-2016 02:56   Dg Chest Port 1 View  01/11/2016  CLINICAL DATA:  Status post central line placement EXAM: PORTABLE CHEST 1 VIEW COMPARISON:  12/28/2015 FINDINGS: Cardiac shadow is stable. A new right jugular central line is noted in the proximal superior vena cava. No pneumothorax is noted. No focal infiltrate or sizable effusion is seen. No bony abnormality is noted. IMPRESSION: No pneumothorax following central line placement. Electronically Signed   By: Inez Catalina M.D.   On: Jan 11, 2016 17:20      Lab Results  Component Value Date   HGBA1C * 07/21/2010    6.0 (NOTE)  According to the ADA Clinical Practice Recommendations for 2011, when HbA1c is used as a screening test:   >=6.5%   Diagnostic of Diabetes Mellitus           (if abnormal result  is confirmed)  5.7-6.4%   Increased risk of developing Diabetes Mellitus  References:Diagnosis and Classification of Diabetes Mellitus,Diabetes [id number] 1):S62-S69 and Standards of Medical Care in         Diabetes - 2011,Diabetes [id number]  (Suppl 1):S11-S61.   Lab Results  Component Value Date   LDLCALC * 07/22/2010    104        Total Cholesterol/HDL:CHD Risk Coronary Heart Disease Risk Table                     Men   Women  1/2 Average Risk   3.4   3.3  Average Risk       5.0   4.4  2 X Average Risk   9.6   7.1  3 X Average Risk  23.4   11.0         Use the calculated Patient Ratio above and the CHD Risk Table to determine the patient's CHD Risk.        ATP III CLASSIFICATION (LDL):  <100     mg/dL   Optimal  100-129  mg/dL   Near or Above                    Optimal  130-159  mg/dL   Borderline  160-189  mg/dL   High  >190     mg/dL   Very High   CREATININE 0.64 01/01/2016       Scheduled Meds: . atorvastatin  80 mg Oral q1800  . cefTRIAXone (ROCEPHIN)  IV  2 g Intravenous Q12H  . docusate sodium  100 mg Oral BID  . folic acid  1 mg Oral Daily  . levETIRAcetam  500 mg Intravenous Q12H  . multivitamin with minerals  1 tablet Oral Daily  . pantoprazole (PROTONIX) IV  40 mg Intravenous QHS  . pneumococcal 23 valent vaccine  0.5 mL Intramuscular Tomorrow-1000  . thiamine  100 mg Oral Daily   Or  . thiamine  100 mg Intravenous Daily  . vancomycin  1,000 mg Intravenous Q8H   Continuous Infusions: . 0.9 % NaCl with KCl 20 mEq / L 75 mL/hr at 12/31/15 2108     LOS: 2 days    Time spent: >30 MINS    [facility]  [facility] Pager [phone]. If 7PM-7AM, please contact night-coverage at [email or web address], password [email or web address] 01/01/2016, 8:59 AM  LOS: 2 days

## 2016-01-02 ENCOUNTER — Encounter (HOSPITAL_COMMUNITY): Payer: Self-pay | Admitting: Radiology

## 2016-01-02 ENCOUNTER — Inpatient Hospital Stay (HOSPITAL_COMMUNITY): Payer: MEDICAID

## 2016-01-02 ENCOUNTER — Other Ambulatory Visit (HOSPITAL_COMMUNITY): Payer: Self-pay

## 2016-01-02 ENCOUNTER — Inpatient Hospital Stay (HOSPITAL_COMMUNITY): Payer: Self-pay

## 2016-01-02 DIAGNOSIS — R55 Syncope and collapse: Secondary | ICD-10-CM

## 2016-01-02 LAB — COMPREHENSIVE METABOLIC PANEL
ALT: 13 U/L — AB (ref 17–63)
AST: 13 U/L — AB (ref 15–41)
Albumin: 2.9 g/dL — ABNORMAL LOW (ref 3.5–5.0)
Alkaline Phosphatase: 82 U/L (ref 38–126)
Anion gap: 10 (ref 5–15)
BUN: <5 mg/dL — ABNORMAL LOW (ref 6–20)
CALCIUM: 8.9 mg/dL (ref 8.9–10.3)
CHLORIDE: 104 mmol/L (ref 101–111)
CO2: 26 mmol/L (ref 22–32)
CREATININE: 0.63 mg/dL (ref 0.61–1.24)
GFR calc Af Amer: >60 mL/min (ref >60)
GFR calc non Af Amer: >60 mL/min (ref >60)
Glucose, Bld: 101 mg/dL — ABNORMAL HIGH (ref 65–99)
Potassium: 4.1 mmol/L (ref 3.5–5.1)
SODIUM: 140 mmol/L (ref 135–145)
Total Bilirubin: 0.7 mg/dL (ref 0.3–1.2)
Total Protein: 6.9 g/dL (ref 6.5–8.1)

## 2016-01-02 LAB — CBC
HCT: 41 % (ref 39.0–52.0)
Hemoglobin: 13.4 g/dL (ref 13.0–17.0)
MCH: 31.2 pg (ref 26.0–34.0)
MCHC: 32.7 g/dL (ref 30.0–36.0)
MCV: 95.3 fL (ref 78.0–100.0)
PLATELETS: 311 10*3/uL (ref 150–400)
RBC: 4.3 MIL/uL (ref 4.22–5.81)
RDW: 14.2 % (ref 11.5–15.5)
WBC: 14.5 10*3/uL — AB (ref 4.0–10.5)

## 2016-01-02 LAB — ECHOCARDIOGRAM COMPLETE
HEIGHTINCHES: 68 in
WEIGHTICAEL: 2571.45 [oz_av]

## 2016-01-02 LAB — VANCOMYCIN, TROUGH: VANCOMYCIN TR: 8 ug/mL — AB (ref 10.0–20.0)

## 2016-01-02 MED ORDER — VANCOMYCIN HCL 10 G IV SOLR
1250.0000 mg | Freq: Three times a day (TID) | INTRAVENOUS | Status: DC
  Administered 2016-01-02 – 2016-01-07 (×14): 1250 mg via INTRAVENOUS
  Filled 2016-01-02 (×20): qty 1250

## 2016-01-02 MED ORDER — IOPAMIDOL (ISOVUE-300) INJECTION 61%
INTRAVENOUS | Status: AC
  Administered 2016-01-02: 75 mL
  Filled 2016-01-02: qty 75

## 2016-01-02 NOTE — Evaluation (Signed)
Physical Therapy Evaluation Patient Details Name: Keyondre Fattore MRN: [id number] DOB: [birth year] Today's Date: 01/02/2016   History of Present Illness  This 50 y.o. male transferred from [place] after LOC.  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  He underwetn Lt frontal craniotomy 12/31/15.   PMH includes:  ischemic cardiomyopathy, MI, CAD, HTN, tobacco abuse.  Clinical Impression  Pt was able to get up and walk with PT during this session at a supervision level for safety.  Balance and leg strength seem to be intact, however, ability to sequence and plan a task are significantly impaired.  He also seems to have decreased coordination and slower movement when attempting coordinated tasks with his right hand.  As far as gross visual assessment is concerned he seemed to have intact peripheral vision.  He forgets to use his right hand and doesn't seem to scan to the right when preforming path finding in the hallway.  He is a significant safety risk to be at home alone, so if he does discharge home he will need 24/7 constant supervision to be safe.    Follow Up Recommendations Outpatient PT;Supervision/Assistance - 24 hour (he has to have 24/7 supervision for cognitive deficits)    Equipment Recommendations  None recommended by PT    Recommendations for Other Services    NA    Precautions / Restrictions Precautions Precautions: Fall Precaution Comments: due to safety issues      Mobility  Bed Mobility Overal bed mobility: Needs Assistance Bed Mobility: Supine to Sit     Supine to sit: Supervision     General bed mobility comments: supervision for safety  Transfers Overall transfer level: Needs assistance Equipment used: None Transfers: Sit to/from Stand Sit to Stand: Supervision         General transfer comment: supervision for safety supervision for safety and line management  Ambulation/Gait Ambulation/Gait assistance:  Supervision Ambulation Distance (Feet): 150 Feet Assistive device: None Gait Pattern/deviations: WFL(Within Functional Limits)   Gait velocity interpretation: at or above normal speed for age/gender General Gait Details: pt supervision with gait, however, when doing a path finding task he was unable to recognize that there were rooms or room numbers on the right side of his visual field.  He turned all the way around 360 degrees to his left to find a room/room number on his right side.  This was odd, because as best as I tried he would not run into obstacles on his right even when I steered him into the path of things on his right.        Balance Overall balance assessment: Needs assistance Sitting-balance support: Feet supported;No upper extremity supported Sitting balance-Leahy Scale: Good     Standing balance support: No upper extremity supported Standing balance-Leahy Scale: Good   Single Leg Stance - Right Leg: 10 Single Leg Stance - Left Leg: 10 Tandem Stance - Right Leg: 30 Tandem Stance - Left Leg: 30     High level balance activites: Turns;Direction changes;Backward walking;Sudden stops High Level Balance Comments: all with supervision             Pertinent Vitals/Pain Pain Assessment: Faces Faces Pain Scale: Hurts whole lot Pain Location: head Pain Descriptors / Indicators: Aching Pain Intervention(s): Limited activity within patient's tolerance;Monitored during session;Repositioned    Home Living Family/patient expects to be discharged to:: Private residence Living Arrangements: Other relatives (sister ) Available Help at Discharge: Family;Available 24 hours/day Type of Home: House Home Access: Stairs to enter  Entrance Stairs-Number of Steps: 1 at rear entrance  Home Layout: One level Home Equipment: None Additional Comments: Pt with expressive communication deficits.  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      Hand Dominance   Dominant Hand: Left (got inconsistant answers)    Extremity/Trunk Assessment   Upper Extremity Assessment: RUE deficits/detail RUE Deficits / Details: with fine motor tasks pt had decreased coordination of his right hand, it was slower to move as well.           Lower Extremity Assessment: Overall WFL for tasks assessed (per functional assessment)      Cervical / Trunk Assessment: Normal  Communication   Communication: Expressive difficulties  Cognition Arousal/Alertness: Awake/alert Behavior During Therapy: Flat affect Overall Cognitive Status: Impaired/Different from baseline Area of Impairment: Attention;Memory;Problem solving;Following commands   Current Attention Level: Selective Memory: Decreased short-term memory Following Commands: Follows multi-step commands inconsistently;Follows multi-step commands with increased time     Problem Solving: Slow processing;Difficulty sequencing;Requires verbal cues General Comments: Pt able to consistantly follow basic, one step commands, difficulty with complex commands and needs repetition and step by step sequencing.  Slow to answer orientation questions, but accurate.      General Comments General comments (skin integrity, edema, etc.): Tested R peripherial vision with no difference compared to left.           Assessment/Plan    PT Assessment Patient needs continued PT services  PT Diagnosis Altered mental status;Generalized weakness;Acute pain   PT Problem List Decreased mobility;Decreased cognition;Decreased safety awareness;Decreased knowledge of precautions;Pain  PT Treatment Interventions Gait training;Stair training;Functional mobility training;Therapeutic activities;Therapeutic exercise;Balance  training;Neuromuscular re-education;Cognitive remediation;Patient/family education   PT Goals (Current goals can be found in the Care Plan section) Acute Rehab PT Goals Patient Stated Goal: unable to state Time For Goal Achievement: 01/16/16 Potential to Achieve Goals: Good    Frequency Min 3X/week           End of Session Equipment Utilized During Treatment: Gait belt Activity Tolerance: Patient tolerated treatment well Patient left: in chair;with call bell/phone within reach;with chair alarm set           Time: [id number] PT Time Calculation (min) (ACUTE ONLY): 17 min   Charges:   PT Evaluation $PT Eval Moderate Complexity: 1 Procedure          Louvenia Golomb B. [place], [place], DPT [phone]   01/02/2016, 4:58 PM

## 2016-01-02 NOTE — Progress Notes (Signed)
  Echocardiogram 2D Echocardiogram has been performed.  Jeffery Prince 01/02/2016, 2:59 PM

## 2016-01-02 NOTE — Progress Notes (Signed)
[place] for Infectious Disease    Date of Admission:  12/30/2015   Total days of antibiotics 3        Day 3 ceftriaxone        Day 3 vanco        Day 3 metronidazole   ID: Jeffery Prince is a 50 y.o. male with  Brain abscess Principal Problem:   Brain mass Active Problems:   Alcohol abuse   CORONARY ATHEROSCLEROSIS NATIVE CORONARY ARTERY   HTN (hypertension)   Brain tumor ([facility])    Subjective: Afebrile, RN reports that he has been abulating still some unsteadiness in gait. Still has expressive aphasia  24hr: all cultures negative  Medications:  . atorvastatin  80 mg Oral q1800  . cefTRIAXone (ROCEPHIN)  IV  2 g Intravenous Q12H  . docusate sodium  100 mg Oral BID  . feeding supplement (ENSURE ENLIVE)  237 mL Oral BID BM  . folic acid  1 mg Oral Daily  . levETIRAcetam  500 mg Intravenous Q12H  . metronidazole  500 mg Intravenous Q8H  . multivitamin with minerals  1 tablet Oral Daily  . pantoprazole (PROTONIX) IV  40 mg Intravenous QHS  . thiamine  100 mg Oral Daily   Or  . thiamine  100 mg Intravenous Daily  . vancomycin  1,250 mg Intravenous Q8H    Objective: Vital signs in last 24 hours: Temp:  [97.8 F (36.6 C)-98.4 F (36.9 C)] 97.8 F (36.6 C) (05/18 1200) Pulse Rate:  [63-97] 73 (05/18 1500) Resp:  [6-31] 19 (05/18 1400) BP: (94-127)/(54-96) 113/81 mmHg (05/18 1500) SpO2:  [98 %-100 %] 100 % (05/18 1500)  Out of room  Lab Results  Recent Labs  01/01/16 0621 01/02/16 0326  WBC 17.1* 14.5*  HGB 14.4 13.4  HCT 41.9 41.0  NA 136 140  K 4.5 4.1  CL 103 104  CO2 21* 26  BUN 6 <5*  CREATININE 0.64 0.63   Liver Panel  Recent Labs  01/01/16 0621 01/02/16 0326  PROT 6.9 6.9  ALBUMIN 3.3* 2.9*  AST 23 13*  ALT 13* 13*  ALKPHOS 92 82  BILITOT 2.0* 0.7   No results found for: [place], [place]  Microbiology: Brain tissue = no growth at 48hr Studies/Results: Ct Maxillofacial W/cm  01/02/2016  CLINICAL DATA:  50 year old  male with syncope. Cerebral abscess. Left facial pain since this morning. Initial encounter. EXAM: CT MAXILLOFACIAL WITH CONTRAST TECHNIQUE: Multidetector CT imaging of the maxillofacial structures was performed with intravenous contrast. Multiplanar CT image reconstructions were also generated. A small metallic BB was placed on the right temple in order to reliably differentiate right from left. CONTRAST:  2mL ISOVUE-300 IOPAMIDOL (ISOVUE-300) INJECTION 61% COMPARISON:  Brain MRI 12/31/2015 and earlier. FINDINGS: Grossly stable visualized brain parenchyma. The level of the left cerebral abscess is not included. Prominent anterior left frontal lobe cortical vein is enhancing and appear stable. Calcified atherosclerosis at the skull base. Major vascular structures in the neck and at the skullbase are patent, including the left IJ. Visualized Pharynx and larynx: Negative. Mild retained secretions in the nasopharynx. Negative parapharyngeal and retropharyngeal spaces. Salivary glands: Negative sublingual space, submandibular glands and parotid glands. Lymph nodes: No upper cervical lymphadenopathy. Orbits: No orbits soft tissue abnormality. Mastoids and visualized paranasal sinuses: Trace paranasal sinus mucosal thickening. No paranasal sinus fluid level or bubbly opacity. Bilateral tympanic cavities are clear. Bilateral mastoids are clear. Skeleton: Right greater than left frequent poor dentition. Mandible intact. No  acute facial fracture. No acute osseous abnormality identified. No superficial soft tissue inflammation identified in the face. IMPRESSION: 1. No abnormality identified to explain acute facial pain. There is right greater than left poor dentition. 2. Stable visualized brain parenchyma, the level of the left frontal abscess is not included. See also Brain MRI report 12/31/2015. Electronically Signed   By: [name] M.D.   On: 01/02/2016 13:40     Assessment/Plan: Brain abscess = plan to get picc line  tomorrow in order to receive 6-8 wk IV therapy for brain abscess.continue on current regimen of vancomycin, ceftriaxone and metronidazole. Continue to follow cultures  vanco goal trough is 15-20.   Expressive aphasia = may benefit from working with neuro-rehab   Baxter Flattery [facility] for Infectious Diseases Cell: [phone] Pager: [phone]  01/02/2016, 4:48 PM

## 2016-01-02 NOTE — Progress Notes (Signed)
Pharmacy Antibiotic Note  Jeffery Prince is a 50 y.o. male here with syncope.  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 He is also noted on ceftriaxone. ID saw and recommends ceftriaxone 2gm IV q12hr and metronidazole  Day #3 of abx for L frontal brain abscess. MRI of brain showed stable abscess unlikely to represent metastatic disease. ID saw and recommends continuing broad spectrum Vanc, CTX, and Flagyl for now. Afebrile, WBC down to 14.5. VT low at 8 but was given dose before early and trough drawn a little late so will plan to increase dose slightly still.  Plan: Increase vancomycin to 1,250mg  IV q8h Continue ceftriaxone to 2gm IV q12h Continue Flagyl 500mg  IV Q8h Monitor clinical picture, renal function, VT prn F/U C&S, abx deescalation / LOT  May need minimum of 6 weeks of IV abx per ID If blood cx negative on 5/19 consider placing PICC   Height: 5\' 8"  (172.7 cm) Weight: 160 lb 11.5 oz (72.9 kg) IBW/kg (Calculated) : 68.4  Temp (24hrs), Avg:98.2 F (36.8 C), Min:97.8 F (36.6 C), Max:98.4 F (36.9 C)   Recent Labs Lab 12/30/15 2009 12/31/15 0433 01/01/16 0621 01/02/16 0326 01/02/16 1328  WBC 11.7* 11.2* 17.1* 14.5*  --   CREATININE 0.53* 0.75 0.64 0.63  --   VANCOTROUGH  --   --   --   --  8*    Estimated Creatinine Clearance: 106.9 mL/min (by C-G formula based on Cr of 0.63).    No Known Allergies  Antimicrobials this admission: 5/16 Ceftriaxone >> 5/16 vancomycin >> 5/17 Flagyl >>  Dose adjustments this admission:   5/18 VT = 8 (Drawn slightly late and morning dose given   early; will still increase rate slightly)  Microbiology results: 5/16 blood x2: ngtd 5/16 wound: pending 5/16 abscess: pending 5/15 MRSA PCR- neg  Thank you for allowing pharmacy to be a part of this patient's care.  Elenor Quinones, PharmD, BCPS Clinical Pharmacist Pager [phone] 01/02/2016 2:19 PM

## 2016-01-02 NOTE — Progress Notes (Signed)
Patient ID: Jeffery Prince, male   DOB: Aug 08, 1966, 50 y.o.   MRN: [id number] Vital signs are stable Patient still displaying some aphasia Motor function appears to be intact Appreciate help of infectious diseases We'll transfer to the floor Continues IV antibiotics until more definitive treatment plan is formulated

## 2016-01-02 NOTE — Progress Notes (Signed)
Noted pt needs 6 weeks IV antibiotic therapy at dc.  Met with pt to discuss dc plans.  Pt states that prior to admission, he lives alone, but his sister will be able to assist with IV antibiotics at home, if needed.  Pt is uninsured, but may qualify for disability.  Referral to Carolynn Sayers, IV infusion coordinator with [facility] to follow for home IV antibiotic therapy.  Will follow progress.    Reinaldo Raddle, RN, BSN  Trauma/Neuro ICU Case Manager [phone]

## 2016-01-02 NOTE — Progress Notes (Signed)
[facility] PROGRESS NOTE  Thuan Sheldrick [id number] DOB: 21-Jan-1966 DOA: 12/30/2015   PCP: No primary care provider on file.     Assessment/Plan: Principal Problem:   Brain mass Active Problems:   Alcohol abuse   CORONARY ATHEROSCLEROSIS NATIVE CORONARY ARTERY   HTN (hypertension)   Brain tumor ([place])   Jeffery Prince is a 50 y.o. male with medical history significant of CAD s/p stent in 2012, has been off of his meds due to inability to afford for past 5 months. Patient presented to [facility] after an episode of apparent syncope that lasted 10 mins. He was admitted to [facility], serial cardiac enzymes were negative, cardiac stress test was negative. CT scan was performed in the ED at the time of admission and demonstrated apparent L frontal lobe mass. MRI was performed which suggested a ring enhancing abscess vs mass. He was transferred to [facility] for neurosurgical consult.  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 Started on vancomycin, ceftriaxone, Flagyl   for brain abscess. Blood cultures drawn 5/16, no growth so far. Status post loading   with keppra due to fact that syncope episode may have been related to seizure.  Received 1gm load IV then 500mg  IV BID keppra.HIV antibody nonreactive.. 2-D echo to rule out embolic source for the brain abscess, repeat blood culture on 5/19, if negative can place PICC line for prolonged IV antibiotics minimum 6-8 weeks. Patient will need repeat imaging. Infectious disease recommended to rule out dental [place]/abscess, no Panorex available therefore ordered a CT maxillofacial to rule out dental abscess.   H/o EtOH abuse - patient on CIWA, check iron level   H/o CAD - Continue statin, hold off on aspirin Negative stress test at [facility] today based on their transfer documentation (EF 51%), so should be clear from a cardiac perspective for surgery  (which isnt really elective anyhow).   HTN - not currently taking home meds, will hold off on starting any new meds at the moment   DVT prophylaxsis SCDs/Lovenox?  Code Status:  Full code     Family Communication: Discussed in detail with the patient, all imaging results, lab results explained to the patient   Disposition Plan:  Disposition per neurosurgery, infectious disease      Consultants:  Neurosurgery  Infectious disease  Procedures: Left frontal craniotomy for evacuation of brain abscess utilizing Brain[facility] neuronavigation  Antibiotics: Rocephin 5/16 Vancomycin 5/16 Flagyl 5/17        HPI/Subjective: Patient no longer aphasic but still confused  Objective: Filed Vitals:   01/02/16 0600 01/02/16 0700 01/02/16 0800 01/02/16 0900  BP: 120/77 127/91 122/85 112/72  Pulse: 66 73 83 66  Temp:   98 F (36.7 C)   TempSrc:   Oral   Resp: 20 22 14 20   Height:      Weight:      SpO2: 98% 99% 99% 100%    Intake/Output Summary (Last 24 hours) at 01/02/16 0955 Last data filed at 01/02/16 0900  Gross per 24 hour  Intake   4085 ml  Output   1400 ml  Net   2685 ml    Exam:  Examination:  General exam: Appears calm and comfortable  Respiratory system: Clear to auscultation. Respiratory effort normal. Cardiovascular system: S1 & S2 heard, RRR. No JVD, murmurs, rubs, gallops or clicks. No pedal edema. Gastrointestinal system: Abdomen is nondistended, soft and nontender. No organomegaly or masses felt. Normal  bowel sounds heard. Central nervous system: Alert and oriented. No focal neurological deficits. Extremities: Symmetric 5 x 5 power. Skin: No rashes, lesions or ulcers Psychiatry: Judgement and insight appear normal. Mood & affect appropriate.     Data Reviewed: I have personally reviewed following labs and imaging studies  Micro Results Recent Results (from the past 240 hour(s))  MRSA PCR Screening     Status: None   Collection  Time: 12/30/15  5:51 PM  Result Value Ref Range Status   MRSA by PCR NEGATIVE NEGATIVE Final    Comment:        The GeneXpert MRSA Assay (FDA approved for NASAL specimens only), is one component of a comprehensive MRSA colonization surveillance program. It is not intended to diagnose MRSA infection nor to guide or monitor treatment for MRSA infections.   Anaerobic culture     Status: None (Preliminary result)   Collection Time: 12/31/15  3:52 PM  Result Value Ref Range Status   Specimen Description ABSCESS  Final   Special Requests DRAINAGE FROM BRAIN  Final   Gram Stain   Final    FEW WBC PRESENT, PREDOMINANTLY PMN NO SQUAMOUS EPITHELIAL CELLS SEEN NO ORGANISMS SEEN Performed at [facility] Performed at [facility]    Culture PENDING  Incomplete   Report Status PENDING  Incomplete  Gram stain     Status: None   Collection Time: 12/31/15  3:52 PM  Result Value Ref Range Status   Specimen Description ABSCESS  Final   Special Requests DRAINAGE FROM BRAIN  Final   Gram Stain   Final    FEW WBC PRESENT, PREDOMINANTLY PMN NO ORGANISMS SEEN Gram Stain Report Called to,Read Back By and Verified With:    Report Status 12/31/2015 FINAL  Final  Culture, routine-abscess     Status: None (Preliminary result)   Collection Time: 12/31/15  3:52 PM  Result Value Ref Range Status   Specimen Description ABSCESS  Final   Special Requests DRAINAGE FROM BRAIN  Final   Gram Stain   Final    FEW WBC PRESENT, PREDOMINANTLY PMN NO SQUAMOUS EPITHELIAL CELLS SEEN NO ORGANISMS SEEN Performed at [facility] Performed at [facility]    Culture   Final    NO GROWTH 2 DAYS Performed at [facility]    Report Status PENDING  Incomplete  Culture, blood (routine x 2)     Status: None (Preliminary result)   Collection Time: 12/31/15  8:04 PM  Result Value Ref Range Status   Specimen Description BLOOD CENTRAL LINE  Final   Special Requests BOTTLES  DRAWN AEROBIC AND ANAEROBIC 5CC  Final   Culture NO GROWTH < 24 HOURS  Final   Report Status PENDING  Incomplete  Culture, blood (routine x 2)     Status: None (Preliminary result)   Collection Time: 12/31/15  8:10 PM  Result Value Ref Range Status   Specimen Description BLOOD LEFT ARM  Final   Special Requests BOTTLES DRAWN AEROBIC AND ANAEROBIC 5CC  Final   Culture NO GROWTH < 24 HOURS  Final   Report Status PENDING  Incomplete  MRSA PCR Screening     Status: None   Collection Time: 01/01/16 11:44 AM  Result Value Ref Range Status   MRSA by PCR NEGATIVE NEGATIVE Final    Comment:        The GeneXpert MRSA Assay (FDA approved for NASAL specimens only), is one component of a comprehensive MRSA colonization  surveillance program. It is not intended to diagnose MRSA infection nor to guide or monitor treatment for MRSA infections.     Radiology Reports Mr Jeri Cos Wo Contrast  12/31/2015  CLINICAL DATA:  Syncopal episode today. Follow-up brain tumor versus abscess. EXAM: MRI HEAD WITHOUT AND WITH CONTRAST TECHNIQUE: Multiplanar, multiecho pulse sequences of the brain and surrounding structures were obtained without and with intravenous contrast. CONTRAST:  34mL MULTIHANCE GADOBENATE DIMEGLUMINE 529 MG/ML IV SOLN COMPARISON:  MRI of the head May 01, 2016 FINDINGS: INTRACRANIAL CONTENTS: Stable 27 x 23 x 34 mm LEFT frontal lobe cystic mass with extremely bright reduced diffusion, low ADC value and marked surrounding FLAIR T2 hyperintense vasogenic edema. Irregular marginal enhancement with multiple contiguous satellite foci of cystic reduced diffusion and peripheral enhancement extending to the dura with focal dural thickening and enhancement. Local sulcal effacement and mass effect without midline shift. Patchy supratentorial white matter FLAIR T2 hyperintensities. Ventricles and sulci are overall normal for patient's age. ORBITS: The included ocular globes and orbital contents are  non-suspicious. SINUSES: The mastoid air-cells and included paranasal sinuses are well-aerated. SKULL/SOFT TISSUES: No abnormal sellar expansion. No suspicious calvarial bone marrow signal. Craniocervical junction maintained. 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 Findings are unlikely to represent metastatic disease. Mild chronic small vessel ischemic disease. Electronically Signed   By: Elon Alas M.D.   On: 12/31/2015 02:56   Dg Chest Port 1 View  12/31/2015  CLINICAL DATA:  Status post central line placement EXAM: PORTABLE CHEST 1 VIEW COMPARISON:  12/28/2015 FINDINGS: Cardiac shadow is stable. A new right jugular central line is noted in the proximal superior vena cava. No pneumothorax is noted. No focal infiltrate or sizable effusion is seen. No bony abnormality is noted. IMPRESSION: No pneumothorax following central line placement. Electronically Signed   By: Inez Catalina M.D.   On: 12/31/2015 17:20     CBC  Recent Labs Lab 12/30/15 2009 12/31/15 0433 01/01/16 0621 01/02/16 0326  WBC 11.7* 11.2* 17.1* 14.5*  HGB 14.4 14.7 14.4 13.4  HCT 43.4 44.9 41.9 41.0  PLT 326 299 319 311  MCV 94.1 95.9 94.2 95.3  MCH 31.2 31.4 32.4 31.2  MCHC 33.2 32.7 34.4 32.7  RDW 14.2 14.3 14.1 14.2    Chemistries   Recent Labs Lab 12/30/15 2009 12/31/15 0433 01/01/16 0621 01/02/16 0326  NA 136 138 136 140  K 3.6 3.8 4.5 4.1  CL 105 102 103 104  CO2 23 25 21* 26  GLUCOSE 96 96 110* 101*  BUN 7 7 6  <5*  CREATININE 0.53* 0.75 0.64 0.63  CALCIUM 9.0 9.2 8.7* 8.9  AST  --   --  23 13*  ALT  --   --  13* 13*  ALKPHOS  --   --  92 82  BILITOT  --   --  2.0* 0.7   ------------------------------------------------------------------------------------------------------------------ estimated creatinine clearance is 106.9 mL/min (by C-G formula based on Cr of  0.63). ------------------------------------------------------------------------------------------------------------------ No results for input(s): HGBA1C in the last 72 hours. ------------------------------------------------------------------------------------------------------------------ No results for input(s): CHOL, HDL, LDLCALC, TRIG, CHOLHDL, LDLDIRECT in the last 72 hours. ------------------------------------------------------------------------------------------------------------------ No results for input(s): TSH, T4TOTAL, T3FREE, THYROIDAB in the last 72 hours.  Invalid input(s): FREET3 ------------------------------------------------------------------------------------------------------------------ No results for input(s): VITAMINB12, FOLATE, FERRITIN, TIBC, IRON, RETICCTPCT in the last 72 hours.  Coagulation profile  Recent Labs Lab 12/30/15 2009  INR 1.10    No results  for input(s): DDIMER in the last 72 hours.  Cardiac Enzymes No results for input(s): CKMB, TROPONINI, MYOGLOBIN in the last 168 hours.  Invalid input(s): CK ------------------------------------------------------------------------------------------------------------------ Invalid input(s): POCBNP   CBG: No results for input(s): GLUCAP in the last 168 hours.     Studies: Dg Chest Port 1 View  12/31/2015  CLINICAL DATA:  Status post central line placement EXAM: PORTABLE CHEST 1 VIEW COMPARISON:  12/28/2015 FINDINGS: Cardiac shadow is stable. A new right jugular central line is noted in the proximal superior vena cava. No pneumothorax is noted. No focal infiltrate or sizable effusion is seen. No bony abnormality is noted. IMPRESSION: No pneumothorax following central line placement. Electronically Signed   By: Inez Catalina M.D.   On: 12/31/2015 17:20      Lab Results  Component Value Date   HGBA1C * 07/21/2010    6.0 (NOTE)                                                                        According to the ADA Clinical Practice Recommendations for 2011, when HbA1c is used as a screening test:   >=6.5%   Diagnostic of Diabetes Mellitus           (if abnormal result  is confirmed)  5.7-6.4%   Increased risk of developing Diabetes Mellitus  References:Diagnosis and Classification of Diabetes Mellitus,Diabetes [id number] 1):S62-S69 and Standards of Medical Care in         Diabetes - 2011,Diabetes [id number]  (Suppl 1):S11-S61.   Lab Results  Component Value Date   LDLCALC * 07/22/2010    104        Total Cholesterol/HDL:CHD Risk Coronary Heart Disease Risk Table                     Men   Women  1/2 Average Risk   3.4   3.3  Average Risk       5.0   4.4  2 X Average Risk   9.6   7.1  3 X Average Risk  23.4   11.0        Use the calculated Patient Ratio above and the CHD Risk Table to determine the patient's CHD Risk.        ATP III CLASSIFICATION (LDL):  <100     mg/dL   Optimal  100-129  mg/dL   Near or Above                    Optimal  130-159  mg/dL   Borderline  160-189  mg/dL   High  >190     mg/dL   Very High   CREATININE 0.63 01/02/2016       Scheduled Meds: . atorvastatin  80 mg Oral q1800  . cefTRIAXone (ROCEPHIN)  IV  2 g Intravenous Q12H  . docusate sodium  100 mg Oral BID  . feeding supplement (ENSURE ENLIVE)  237 mL Oral BID BM  . folic acid  1 mg Oral Daily  . levETIRAcetam  500 mg Intravenous Q12H  . metronidazole  500 mg Intravenous Q8H  . multivitamin with minerals  1 tablet Oral Daily  . pantoprazole (PROTONIX) IV  40 mg Intravenous QHS  .  thiamine  100 mg Oral Daily   Or  . thiamine  100 mg Intravenous Daily  . vancomycin  1,000 mg Intravenous Q8H   Continuous Infusions: . 0.9 % NaCl with KCl 20 mEq / L 75 mL/hr at 01/02/16 0900     LOS: 3 days    Time spent: >30 MINS    [facility]  [facility] Pager [phone]. If 7PM-7AM, please contact night-coverage at [email or web address], password [facility] 01/02/2016, 9:55 AM  LOS: 3  days

## 2016-01-02 NOTE — Evaluation (Addendum)
Occupational Therapy Evaluation Patient Details Name: Jeffery Prince MRN: [id number] DOB: November 30, 1965 Today's Date: 01/02/2016    History of Present Illness This 50 y.o. male transferred from [place] after LOC.  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  He underwetn Lt frontal craniotomy 12/31/15.   PMH includes:  ischemic cardiomyopathy, MI, CAD, HTN, tobacco abuse.   Clinical Impression   Pt admitted with above. He demonstrates the below listed deficits and will benefit from continued OT to maximize safety and independence with BADLs.  Pt presents to OT with communication deficits (see SLP notes), questionable Rt field deficit (inconistencies noted during testing and functional tasks), and cognitive deficits (requires mod cues to sequence through simple grooming tasks).   He indicates he lives with his sister who can provide 24 hour supervision/assist at discharge.   IF this is correct,optimal recommendation is CIR (due to severity of cognitive and language deficits).  IF CIR is not an options, then Home with 24 hour supervision and OPOT; however, if sister unable to provide this level of assist, he may need SNF if sister is unable to provide 24 hour supervision.        Follow Up Recommendations  Optimally would recommend CIR if that is an option, otherwise, Outpatient OT;Supervision/Assistance - 24 hour    Equipment Recommendations  None recommended by OT    Recommendations for Other Services       Precautions / Restrictions Precautions Precautions: Fall      Mobility Bed Mobility Overal bed mobility: Needs Assistance Bed Mobility: Supine to Sit     Supine to sit: Supervision        Transfers Overall transfer level: Needs assistance   Transfers: Sit to/from Stand;Stand Pivot Transfers Sit to Stand: Supervision Stand pivot transfers: Supervision            Balance Overall balance assessment: Needs assistance Sitting-balance  support: Feet supported Sitting balance-Leahy Scale: Good     Standing balance support: No upper extremity supported;Single extremity supported Standing balance-Leahy Scale: Good                              ADL Overall ADL's : Needs assistance/impaired Eating/Feeding: Independent   Grooming: Wash/dry hands;Wash/dry face;Oral care;Minimal assistance;Standing Grooming Details (indicate cue type and reason): requires min A and mod cues for sequencing and problem solving  Upper Body Bathing: Supervision/ safety;Sitting   Lower Body Bathing: Supervison/ safety;Sit to/from stand   Upper Body Dressing : Supervision/safety;Sitting   Lower Body Dressing: Supervision/safety;Sit to/from stand   Toilet Transfer: Supervision/safety;Ambulation;Comfort height toilet   Toileting- Clothing Manipulation and Hygiene: Supervision/safety;Sit to/from stand       Functional mobility during ADLs: Supervision/safety       Vision Vision Assessment?: Yes Eye Alignment: Within Functional Limits Alignment/Gaze Preference: Within Defined Limits Tracking/Visual Pursuits: Able to track stimulus in all quads without difficulty Visual Fields: Other (comment) Additional Comments: Pt provided inconsistent responses with confrontation testing - difficult to determine if due to communication deficits or due to field deficit.  He did have difficulty locating sink which was on his right side - he turned 360* in order to locate it    Perception Perception Perception Tested?: Yes   [place] tested?: Within functional limits    Pertinent Vitals/Pain Pain Assessment: No/denies pain     Hand Dominance Right   Extremity/Trunk Assessment Upper Extremity Assessment Upper Extremity Assessment: Overall WFL for tasks assessed  Lower Extremity Assessment Lower Extremity Assessment: Defer to PT evaluation   Cervical / Trunk Assessment Cervical / Trunk Assessment: Normal    Communication Communication Communication: Expressive difficulties   Cognition Arousal/Alertness: Awake/alert Behavior During Therapy: Flat affect Overall Cognitive Status: Impaired/Different from baseline Area of Impairment: Attention;Memory;Problem solving;Following commands   Current Attention Level: Selective Memory: Decreased short-term memory Following Commands: Follows multi-step commands inconsistently;Follows multi-step commands with increased time     Problem Solving: Slow processing;Difficulty sequencing;Requires verbal cues General Comments: Cognition was assessed via functional performance due to communication deficits.  Pt required min cues to perform 2-3 step commands.  Pt was instructed how to use foot pedals to control H20 on lavatory to wash his hands.  ~3 mins later, pt was taken back to the sink to brush his teeth and was unable to recall how to operate the foot pedal at that time.  He required mod verbal cues to sequence through brushing his teeth.  And, mod cues to problem solve through novel situations    General Comments       Exercises       Shoulder Instructions      Home Living Family/patient expects to be discharged to:: Private residence Living Arrangements: Other relatives (sister ) Available Help at Discharge: Family;Available 24 hours/day Type of Home: House Home Access: Stairs to enter [employer] of Steps: 1 at rear entrance    Home Layout: One level     Bathroom Shower/Tub: Tub/shower unit;Curtain Shower/tub characteristics: Architectural technologist: Standard     Home Equipment: None   Additional Comments: Pt with expressive communication deficits.  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     OT Diagnosis: Generalized weakness;Cognitive deficits;Disturbance of vision   OT Problem List: Decreased strength;Decreased activity tolerance;Impaired vision/perception;Decreased cognition;Decreased safety awareness   OT Treatment/Interventions: Self-care/ADL training;DME and/or AE instruction;Therapeutic activities;Cognitive remediation/compensation;Visual/perceptual remediation/compensation;Patient/family education;Balance training    OT Goals(Current goals can be found in the care plan section) Acute Rehab OT Goals Patient Stated Goal: Pt did not state  OT Goal Formulation: With patient Time For Goal Achievement: 01/16/16 Potential to Achieve Goals: Good ADL Goals Pt Will Perform Grooming: with modified independence;standing Pt Will Perform Upper Body Bathing: with modified independence Pt Will Perform Lower Body Bathing: with modified independence;sit to/from stand Pt Will Perform Upper Body Dressing: with modified independence;sitting;standing Pt Will Perform Lower Body Dressing: with modified independence;sit to/from stand Pt Will Transfer to Toilet: with modified independence;ambulating;regular height toilet Pt Will Perform Toileting - Clothing Manipulation and hygiene: with modified independence;sit to/from stand Additional ADL Goal #1: Pt will locate items on Rt consistently with min verbal cues  Additional ADL Goal #2: Pt will be able to sequence through familiar ADL tasks with no cues   OT Frequency: Min 2X/week   Barriers to D/C: Decreased caregiver support  need to clarify if sister able to provide 24 hour assist        Co-evaluation              End of Session Nurse Communication: Mobility status  Activity Tolerance: Patient tolerated treatment well Patient left: Other (comment) (in w/c for transport to radiology )   Time: [id number] OT Time Calculation (min): 29 min Charges:  OT  General Charges $OT Visit: 1 Procedure OT  Evaluation $OT Eval Moderate Complexity: 1 Procedure OT Treatments $Self Care/Home Management : 8-22 mins G-Codes:    Kenia Teagarden M 19-Jan-2016, 12:05 PM

## 2016-01-02 NOTE — Evaluation (Signed)
Speech Language Pathology Evaluation Patient Details Name: Jeffery Prince MRN: [id number] DOB: 10-07-1965 Today's Date: 01/02/2016 Time: [id number] SLP Time Calculation (min) (ACUTE ONLY): 19 min  Problem List:  Patient Active Problem List   Diagnosis Date Noted  . Brain tumor ([place])   . Brain mass 12/30/2015  . HLD (hyperlipidemia)   . HTN (hypertension)   . Tobacco abuse   . HYPERLIPIDEMIA 08/06/2010  . Alcohol abuse 08/06/2010  . TOBACCO USER 08/06/2010  . HYPERTENSION, BENIGN 08/06/2010  . 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 CORONARY ATHEROSCLEROSIS NATIVE CORONARY ARTERY 08/06/2010  . SKIN RASH 08/06/2010  . CAD (coronary artery disease) 07/17/2010   Past Medical History:  Past Medical History  Diagnosis Date  . Ischemic cardiomyopathy     EF: 45-50% post MI  . MI (myocardial infarction) ([place])   . CAD (coronary artery disease) 07/2010    s/p inferior MI  . HLD (hyperlipidemia)   . HTN (hypertension)   . Tobacco abuse    Past Surgical History:  Past Surgical History  Procedure Laterality Date  . Appendectomy    . Cardiac catheterization  07/2010    occluded R PAV artery with 80% stenosis in RPDA and proximal RCA, LM: 30-40%, LAD: 50% proximal, LCX: 30-40%.  . Coronary angioplasty with stent placement  07/2010    3 bare metal stents to proximal RCA, RPAV artery and RPDA.  Marland Kitchen Coronary angioplasty with stent placement  12/2010    2 DES to R PAV (2.5 X 18 mm Xience)and RPDA (3.0 X 18 mm) for severe ISR  . Craniotomy Left 12/31/2015    Procedure: CRANIOTOMY INTRACRANIAL ABSCESS DRAINAGE WITH BRAIN LAB;  Surgeon: Newman Pies, MD;  Location: [facility];  Service: Neurosurgery;  Laterality: Left;   HPI:  This 50 y.o. male transferred from [facility] after LOC. 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 He underwetn Lt frontal craniotomy 12/31/15. PMH includes: ischemic cardiomyopathy, MI, CAD, HTN, tobacco  abuse.   Assessment / Plan / Recommendation Clinical Impression  Pt referred for speech language assessment following craniotomy for possible brain abcess. 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 Basic auditory comprehension is relatively spared as is naming. Pt demonstrates effortful telegraphic speech with production at the word level. Pt demonstrates intermittent semantic paraphasias and perseveration with limited awareness. Pt would benefit from ongoing SLP services. Pending OT/PT needs and support at home, pt may be a good candidate for CIR. Pt will require ongoing therapy services during this hospitalization to maximize functional communication.      SLP Assessment  Patient needs continued Speech Lanaguage Pathology Services    Follow Up Recommendations  Inpatient Rehab;Outpatient SLP;24 hour supervision/assistance    Frequency and Duration min 2x/week  2 weeks      SLP Evaluation Prior Functioning  Cognitive/Linguistic Baseline: Information not available Type of Home: House Available Help at Discharge: Family;Available 24 hours/day   Cognition  Overall Cognitive Status: Impaired/Different from baseline Arousal/Alertness: Awake/alert Orientation Level: Oriented to person;Oriented to place;Oriented to time (unable to clarify situation secondary to aphasia) Attention: Sustained Sustained Attention: Appears intact Memory: Appears intact (for basic biographical information) Comments: cognitive function to be further assessed as language allows    Comprehension  Auditory Comprehension Overall Auditory Comprehension: Impaired Yes/No Questions: Impaired (generally correct responses with additional time for process) Commands: Impaired Two Step Basic Commands: 75-100% accurate (with increased time) Multistep Basic Commands: 0-24% accurate Conversation: Simple (impaired) Interfering  Components: Processing  speed EffectiveTechniques: Extra processing time;Repetition Visual Recognition/Discrimination Discrimination: Not tested Reading Comprehension Reading Status: Not tested    Expression Expression Primary Mode of Expression: Verbal Verbal Expression Overall Verbal Expression: Impaired Initiation: Impaired Automatic Speech: Counting (1-20 WFL) Level of Generative/Spontaneous Verbalization: Word (intermittent semantic paraphasias noted) Naming: Impairment Responsive: 51-75% accurate Confrontation: Impaired Convergent: 0-24% accurate Divergent: 0-24% accurate Other Naming Comments: sematic paraphasias without awareness Verbal Errors: Semantic paraphasias;Perseveration Effective Techniques: Sentence completion;Semantic cues Written Expression Dominant Hand: Right Written Expression: Not tested   Oral / Motor  Oral Motor/Sensory Function Overall Oral Motor/Sensory Function: Mild impairment Facial ROM: Reduced left (mildly reduced labial movement upon retraction) Motor Speech Overall Motor Speech: Appears within functional limits for tasks assessed Respiration: Within functional limits Phonation: Normal Resonance: Within functional limits Articulation: Within functional limitis Intelligibility: Intelligible Motor Planning: Witnin functional limits Motor Speech Errors: Not applicable   Shel[place] MA, [place] Pager [phone] 01/02/2016, 2:40 PM

## 2016-01-03 LAB — CULTURE, ROUTINE-ABSCESS: Culture: NO GROWTH

## 2016-01-03 LAB — COMPREHENSIVE METABOLIC PANEL
ALT: 16 U/L — ABNORMAL LOW (ref 17–63)
ANION GAP: 11 (ref 5–15)
AST: 13 U/L — AB (ref 15–41)
Albumin: 2.7 g/dL — ABNORMAL LOW (ref 3.5–5.0)
Alkaline Phosphatase: 86 U/L (ref 38–126)
BILIRUBIN TOTAL: 0.5 mg/dL (ref 0.3–1.2)
BUN: <5 mg/dL — ABNORMAL LOW (ref 6–20)
CHLORIDE: 106 mmol/L (ref 101–111)
CO2: 24 mmol/L (ref 22–32)
Calcium: 8.7 mg/dL — ABNORMAL LOW (ref 8.9–10.3)
Creatinine, Ser: 0.57 mg/dL — ABNORMAL LOW (ref 0.61–1.24)
Glucose, Bld: 97 mg/dL (ref 65–99)
POTASSIUM: 3.8 mmol/L (ref 3.5–5.1)
Sodium: 141 mmol/L (ref 135–145)
TOTAL PROTEIN: 6.5 g/dL (ref 6.5–8.1)

## 2016-01-03 LAB — CBC
HEMATOCRIT: 40.2 % (ref 39.0–52.0)
Hemoglobin: 13.3 g/dL (ref 13.0–17.0)
MCH: 31.2 pg (ref 26.0–34.0)
MCHC: 33.1 g/dL (ref 30.0–36.0)
MCV: 94.4 fL (ref 78.0–100.0)
PLATELETS: 311 10*3/uL (ref 150–400)
RBC: 4.26 MIL/uL (ref 4.22–5.81)
RDW: 14.3 % (ref 11.5–15.5)
WBC: 13.7 10*3/uL — AB (ref 4.0–10.5)

## 2016-01-03 LAB — HEPATITIS PANEL, ACUTE
HEP B S AG: NEGATIVE
Hep A IgM: NEGATIVE
Hep B C IgM: NEGATIVE

## 2016-01-03 MED ORDER — SODIUM CHLORIDE 0.9% FLUSH
10.0000 mL | INTRAVENOUS | Status: DC | PRN
  Administered 2016-01-06 – 2016-01-07 (×2): 10 mL
  Filled 2016-01-03 (×2): qty 40

## 2016-01-03 MED ORDER — LEVETIRACETAM 500 MG PO TABS
500.0000 mg | ORAL_TABLET | Freq: Two times a day (BID) | ORAL | Status: DC
  Administered 2016-01-03 – 2016-01-07 (×9): 500 mg via ORAL
  Filled 2016-01-03 (×9): qty 1

## 2016-01-03 MED ORDER — PANTOPRAZOLE SODIUM 40 MG PO TBEC
40.0000 mg | DELAYED_RELEASE_TABLET | Freq: Every day | ORAL | Status: DC
  Administered 2016-01-03 – 2016-01-07 (×5): 40 mg via ORAL
  Filled 2016-01-03 (×5): qty 1

## 2016-01-03 MED ORDER — METRONIDAZOLE IN NACL 5-0.79 MG/ML-% IV SOLN
500.0000 mg | Freq: Four times a day (QID) | INTRAVENOUS | Status: DC
  Administered 2016-01-03 – 2016-01-07 (×16): 500 mg via INTRAVENOUS
  Filled 2016-01-03 (×22): qty 100

## 2016-01-03 NOTE — Progress Notes (Signed)
Peripherally Inserted Central Catheter/Midline Placement  The IV Nurse has discussed with the patient and/or persons authorized to consent for the patient, the purpose of this procedure and the potential benefits and risks involved with this procedure.  The benefits include less needle sticks, lab draws from the catheter and patient may be discharged home with the catheter.  Risks include, but not limited to, infection, bleeding, blood clot (thrombus formation), and puncture of an artery; nerve damage and irregular heat beat.  Alternatives to this procedure were also discussed.  PICC/Midline Placement Documentation        Jeffery Prince 01/03/2016, 6:47 PM

## 2016-01-03 NOTE — Progress Notes (Signed)
Physical Therapy Treatment Patient Details Name: Jeffery Prince MRN: [id number] DOB: November 09, 1965 Today's Date: 01/03/2016    History of Present Illness This 50 y.o. male transferred from [place] after LOC.  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  He underwetn Lt frontal craniotomy 12/31/15.   PMH includes:  ischemic cardiomyopathy, MI, CAD, HTN, tobacco abuse.    PT Comments    Pt is progressing well with his mobility, still shows signs of right side neglect, slow/decreased coordination of right hand, and safety/cognitive issues.  Pt would not be safe at home alone and needs 24/7 supervision for safety.    Follow Up Recommendations  Outpatient PT;Supervision/Assistance - 24 hour (he has to have 24/7 due to cognition and safety issues)     Equipment Recommendations  None recommended by PT    Recommendations for Other Services   NA     Precautions / Restrictions Precautions Precautions: Fall Precaution Comments: due to safety issues    Mobility  Bed Mobility Overal bed mobility: Independent                Transfers Overall transfer level: Modified independent Equipment used: None                Ambulation/Gait Ambulation/Gait assistance: Modified independent (Device/Increase time) Ambulation Distance (Feet): 200 Feet Assistive device: None Gait Pattern/deviations: WFL(Within Functional Limits) Gait velocity: decreased Gait velocity interpretation: Below normal speed for age/gender General Gait Details: Pt mod I only due to slower gait speed, but I am not sure if this is not his baseline self selected pace.           Balance Overall balance assessment: Independent                                  Cognition Arousal/Alertness: Awake/alert Behavior During Therapy: Flat affect Overall Cognitive Status: Impaired/Different from baseline Area of Impairment: Attention;Following commands;Problem solving    Current Attention Level: Selective Memory: Decreased short-term memory (had difficulty remembering strategy from earlier-room find) Following Commands: Follows multi-step commands inconsistently     Problem Solving: Slow processing;Difficulty sequencing;Requires verbal cues General Comments: Pt stood at least 5 mins outside of his room trying to find his room.  Despite his ability to tell me that he is in room "2" he could not find it and did not know what to look for without max visual and verbal cues.        General Comments General comments (skin integrity, edema, etc.): Pt continues to have some neglect of his right arm and side when it comes to scanning his environment, sitting or lying on his right hand, and using his right arm for tasks that would usually require both hands, but he somehow manages to do it with one hand (for instance, most people would pull up socks with both hands, he does it with one hand--left).  Still slower/poor coordination/speed of movement on his right side.       Pertinent Vitals/Pain Pain Assessment: Faces Faces Pain Scale: Hurts a little bit           PT Goals (current goals can now be found in the care plan section) Acute Rehab PT Goals Patient Stated Goal: did not state Progress towards PT goals: Progressing toward goals    Frequency  Min 3X/week    PT Plan Current plan remains appropriate       End of Session Equipment  Utilized During Treatment: Gait belt Activity Tolerance: Patient tolerated treatment well Patient left: in chair;with call bell/phone within reach     Time: 1259-1313 PT Time Calculation (min) (ACUTE ONLY): 14 min  Charges:  $Gait Training: 8-22 mins                      [name] B. [place], Margaret, DPT [phone]   01/03/2016, 2:52 PM

## 2016-01-03 NOTE — Progress Notes (Signed)
Patient ID: Jeffery Prince, male   DOB: 14-Jan-1966, 50 y.o.   MRN: [id number] Vital signs stable PICC line being placed Neurologically unchanged with moderate degree of aphasia Will obtain dental consult Will likely need long-term rehabilitation or placement

## 2016-01-03 NOTE — Progress Notes (Signed)
Patient transferred from 3 MW. Patient alert and oriented x 2. Patient made comfortable at this time

## 2016-01-03 NOTE — Progress Notes (Signed)
[facility]    Date of Admission:  12/30/2015   Total days of antibiotics 4        Day 4 ceftriaxone        Day 4 vanco        Day 4 metronidazole   ID: Jeffery Prince is a 50 y.o. male with  Brain abscess Principal Problem:   Brain mass Active Problems:   Alcohol abuse   CORONARY ATHEROSCLEROSIS NATIVE CORONARY ARTERY   HTN (hypertension)   Brain tumor ([facility])    Subjective: Afebrile, RN reports that he has been abulating still some unsteadiness in gait. Still has expressive aphasia  24hr: all cultures negative  Medications:  . atorvastatin  80 mg Oral q1800  . cefTRIAXone (ROCEPHIN)  IV  2 g Intravenous Q12H  . docusate sodium  100 mg Oral BID  . feeding supplement (ENSURE ENLIVE)  237 mL Oral BID BM  . folic acid  1 mg Oral Daily  . levETIRAcetam  500 mg Oral BID  . metronidazole  500 mg Intravenous Q6H  . multivitamin with minerals  1 tablet Oral Daily  . pantoprazole  40 mg Oral Daily  . thiamine  100 mg Oral Daily  . vancomycin  1,250 mg Intravenous Q8H    Objective: Vital signs in last 24 hours: Temp:  [97.8 F (36.6 C)-99.7 F (37.6 C)] 98.9 F (37.2 C) (05/19 1535) Pulse Rate:  [63-87] 80 (05/19 1200) Resp:  [13-23] 17 (05/19 1200) BP: (103-152)/(57-97) 119/78 mmHg (05/19 1200) SpO2:  [97 %-100 %] 100 % (05/19 1200)  Physical Exam  Constitutional: He is oriented to person, place. He appears well-developed and well-nourished. No distress.  HENT: poor dentition, dressing on scalp Mouth/Throat: Oropharynx is clear and moist. No oropharyngeal exudate.  Cardiovascular: Normal rate, regular rhythm and normal heart sounds. Exam reveals no gallop and no friction rub.  No murmur heard.  Pulmonary/Chest: Effort normal and breath sounds normal. No respiratory distress. He has no wheezes.  Abdominal: Soft. Bowel sounds are normal. He exhibits no distension. There is no tenderness.  Lymphadenopathy:  He has no cervical adenopathy.    Neurological: He is alert and oriented to person, place, and time.  Skin: Skin is warm and dry. No rash noted. No erythema.  Psychiatric: He has a normal mood and affect. His behavior is normal.     Lab Results  Recent Labs  01/02/16 0326 01/03/16 0401  WBC 14.5* 13.7*  HGB 13.4 13.3  HCT 41.0 40.2  NA 140 141  K 4.1 3.8  CL 104 106  CO2 26 24  BUN <5* <5*  CREATININE 0.63 0.57*   Liver Panel  Recent Labs  01/02/16 0326 01/03/16 0401  PROT 6.9 6.5  ALBUMIN 2.9* 2.7*  AST 13* 13*  ALT 13* 16*  ALKPHOS 82 86  BILITOT 0.7 0.5   No results found for: [place], [place]  Microbiology: Brain tissue = no growth at 48hr Studies/Results: Ct Maxillofacial W/cm  01/02/2016  CLINICAL DATA:  50 year old male with syncope. Cerebral abscess. Left facial pain since this morning. Initial encounter. EXAM: CT MAXILLOFACIAL WITH CONTRAST TECHNIQUE: Multidetector CT imaging of the maxillofacial structures was performed with intravenous contrast. Multiplanar CT image reconstructions were also generated. A small metallic BB was placed on the right temple in order to reliably differentiate right from left. CONTRAST:  5m ISOVUE-300 IOPAMIDOL (ISOVUE-300) INJECTION 61% COMPARISON:  Brain MRI 12/31/2015 and earlier. FINDINGS: Grossly stable visualized brain parenchyma. The level  of the left cerebral abscess is not included. Prominent anterior left frontal lobe cortical vein is enhancing and appear stable. Calcified atherosclerosis at the skull base. Major vascular structures in the neck and at the skullbase are patent, including the left IJ. Visualized Pharynx and larynx: Negative. Mild retained secretions in the nasopharynx. Negative parapharyngeal and retropharyngeal spaces. Salivary glands: Negative sublingual space, submandibular glands and parotid glands. Lymph nodes: No upper cervical lymphadenopathy. Orbits: No orbits soft tissue abnormality. Mastoids and visualized paranasal sinuses:  Trace paranasal sinus mucosal thickening. No paranasal sinus fluid level or bubbly opacity. Bilateral tympanic cavities are clear. Bilateral mastoids are clear. Skeleton: Right greater than left frequent poor dentition. Mandible intact. No acute facial fracture. No acute osseous abnormality identified. No superficial soft tissue inflammation identified in the face. IMPRESSION: 1. No abnormality identified to explain acute facial pain. There is right greater than left poor dentition. 2. Stable visualized brain parenchyma, the level of the left frontal abscess is not included. See also Brain MRI report 12/31/2015. Electronically Signed   By: [name] M.D.   On: 01/02/2016 13:40     Assessment/Plan: Brain abscess = plan to get picc line tomorrow in order to receive 6-8 wk IV therapy for brain abscess.continue on current regimen of vancomycin, ceftriaxone and metronidazole.   vanco goal trough is 15-20.   Poor Dentition = consider dental consult for dental extractions  Expressive aphasia = may benefit from working with Pt/OT  Will see him back on Monday, if questions, Dr Linus Salmons Available.  We will arrange for follow up visit in clinic    Home health orders:   Diagnosis: Brain abscess  Culture Result: none  No Known Allergies  Discharge antibiotics: Per pharmacy protocol  Ceftriaxone 2gm IV Q12, metronidazole 551m IV q8, and Vanco per protocol Aim for Vancomycin trough 15-20 (unless otherwise indicated) Duration: 6 wks End Date: June 30th  [place]:  Labs weekly while on IV antibiotics: _x_ CBC with differential _x_ CMP __ CRP __ ESR _x_ Vancomycin trough  Fax weekly labs to ([phone] Clinic Follow Up Appt: In 4-6 wk  @     [place] [facility] Centerfor [facility] Cell: [phone]: [phone]  01/03/2016, 3:40 PM

## 2016-01-03 NOTE — Progress Notes (Signed)
[facility] PROGRESS NOTE  Jeffery Prince [id number] DOB: 09/05/1965 DOA: 12/30/2015   PCP: No primary care provider on file.     Assessment/Plan: Principal Problem:   Brain mass Active Problems:   Alcohol abuse   CORONARY ATHEROSCLEROSIS NATIVE CORONARY ARTERY   HTN (hypertension)   Brain tumor ([place])   Jeffery Prince is a 50 y.o. male with medical history significant of CAD s/p stent in 2012, has been off of his meds due to inability to afford for past 5 months. Patient presented to [facility] after an episode of apparent syncope that lasted 10 mins. He was admitted to [facility], serial cardiac enzymes were negative, cardiac stress test was negative. CT scan was performed in the ED at the time of admission and demonstrated apparent L frontal lobe mass. MRI was performed which suggested a ring enhancing abscess vs mass. He was transferred to [facility] for neurosurgical consult. 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 Started on vancomycin, ceftriaxone, Flagyl   for brain abscess. Patient needs 6-8 weeks of IV antibiotics. Blood cultures drawn 5/16, no growth so far. PICC line placement today Started on Keppra for seizure prophylaxis, syncope possibly related to a seizure.  Received 1gm load IV then 500mg  IV BID keppra.HIV antibody nonreactive.. 2-D echo to rule out embolic source for the brain abscess negative,   Patient will need repeat neuro  imaging. Infectious disease recommended to rule out dental [place]/abscess, no Panorex available therefore ordered a CT maxillofacialwhich did not show any dental abscess. Patient needs to be assessed by inpatient rehabilitation for language/physical therapy services. Inpatient rehabilitation consultation ordered. Patient found to have some expressive aphasia  which is  improving.  H/o EtOH abuse -continue   thiamine and folic acid  H/o CAD - Continue statin, hold off on aspirin Negative stress test at [facility] today based on their transfer documentation (EF 51%), so should be clear from a cardiac perspective for surgery (which isnt really elective anyhow).   HTN - not currently taking home meds, will hold off on starting any new meds at the moment   DVT prophylaxsis SCDs/Lovenox?  Code Status:  Full code     Family Communication: Discussed in detail with the patient, all imaging results, lab results explained to the patient   Disposition Plan:  Disposition per neurosurgery,PICC line placement today, CIR consult     Consultants:  Neurosurgery  Infectious disease  Inpatient rehabilitation  Procedures: Left frontal craniotomy for evacuation of brain abscess utilizing Brain[facility] neuronavigation  Antibiotics: Rocephin 5/16 Vancomycin 5/16 Flagyl 5/17        HPI/Subjective: unsteadiness in gait. Still has expressive aphasia  Objective: Filed Vitals:   01/03/16 0743 01/03/16 0800 01/03/16 1135 01/03/16 1200  BP:  145/70  119/78  Pulse:  76  80  Temp: 97.8 F (36.6 C)  98 F (36.7 C)   TempSrc: Oral  Oral   Resp:  17  17  Height:      Weight:      SpO2:  100%  100%    Intake/Output Summary (Last 24 hours) at 01/03/16 1409 Last data filed at 01/03/16 1345  Gross per 24 hour  Intake   3490 ml  Output   1545 ml  Net   1945 ml    Exam:  Examination:  General exam: Appears calm and comfortable  Respiratory system: Clear to auscultation. Respiratory effort normal. Cardiovascular system: S1 & S2 heard, RRR. No JVD, murmurs, rubs, gallops or clicks. No pedal edema. Gastrointestinal system: Abdomen is nondistended, soft and nontender. No organomegaly or masses felt. Normal bowel sounds heard. Central nervous system: Alert and oriented. No focal neurological deficits. Extremities:  Symmetric 5 x 5 power. Skin: No rashes, lesions or ulcers Psychiatry: Judgement and insight appear normal. Mood & affect appropriate.     Data Reviewed: I have personally reviewed following labs and imaging studies  Micro Results Recent Results (from the past 240 hour(s))  MRSA PCR Screening     Status: None   Collection Time: 12/30/15  5:51 PM  Result Value Ref Range Status   MRSA by PCR NEGATIVE NEGATIVE Final    Comment:        The GeneXpert MRSA Assay (FDA approved for NASAL specimens only), is one component of a comprehensive MRSA colonization surveillance program. It is not intended to diagnose MRSA infection nor to guide or monitor treatment for MRSA infections.   Anaerobic culture     Status: None (Preliminary result)   Collection Time: 12/31/15  3:52 PM  Result Value Ref Range Status   Specimen Description ABSCESS  Final   Special Requests DRAINAGE FROM BRAIN  Final   Gram Stain   Final    FEW WBC PRESENT, PREDOMINANTLY PMN NO SQUAMOUS EPITHELIAL CELLS SEEN NO ORGANISMS SEEN Performed at [facility] Performed at [facility]    Culture   Final    NO ANAEROBES ISOLATED; CULTURE IN PROGRESS FOR 5 DAYS Performed at [facility]    Report Status PENDING  Incomplete  Gram stain     Status: None   Collection Time: 12/31/15  3:52 PM  Result Value Ref Range Status   Specimen Description ABSCESS  Final   Special Requests DRAINAGE FROM BRAIN  Final   Gram Stain   Final    FEW WBC PRESENT, PREDOMINANTLY PMN NO ORGANISMS SEEN Gram Stain Report Called to,Read Back By and Verified With:    Report Status 12/31/2015 FINAL  Final  Culture, routine-abscess     Status: None   Collection Time: 12/31/15  3:52 PM  Result Value Ref Range Status   Specimen Description ABSCESS  Final   Special Requests DRAINAGE FROM BRAIN  Final   Gram Stain   Final    FEW WBC PRESENT, PREDOMINANTLY PMN NO SQUAMOUS EPITHELIAL CELLS SEEN NO ORGANISMS SEEN Performed  at [facility] Performed at [facility]    Culture   Final    NO GROWTH 2 DAYS Performed at [facility]    Report Status 01/03/2016 FINAL  Final  Culture, blood (routine x 2)     Status: None (Preliminary result)   Collection Time: 12/31/15  8:04 PM  Result Value Ref Range Status   Specimen Description BLOOD CENTRAL LINE  Final   Special Requests BOTTLES DRAWN AEROBIC AND ANAEROBIC 5CC  Final   Culture NO GROWTH 3 DAYS  Final   Report Status PENDING  Incomplete  Culture, blood (routine x 2)     Status: None (Preliminary result)   Collection Time: 12/31/15  8:10 PM  Result Value Ref Range Status   Specimen Description BLOOD LEFT ARM  Final   Special Requests BOTTLES DRAWN AEROBIC AND ANAEROBIC 5CC  Final   Culture NO GROWTH 3 DAYS  Final   Report Status PENDING  Incomplete  MRSA PCR Screening  Status: None   Collection Time: 01/01/16 11:44 AM  Result Value Ref Range Status   MRSA by PCR NEGATIVE NEGATIVE Final    Comment:        The GeneXpert MRSA Assay (FDA approved for NASAL specimens only), is one component of a comprehensive MRSA colonization surveillance program. It is not intended to diagnose MRSA infection nor to guide or monitor treatment for MRSA infections.     Radiology Reports Mr Jeri Cos Wo Contrast  12/31/2015  CLINICAL DATA:  Syncopal episode today. Follow-up brain tumor versus abscess. EXAM: MRI HEAD WITHOUT AND WITH CONTRAST TECHNIQUE: Multiplanar, multiecho pulse sequences of the brain and surrounding structures were obtained without and with intravenous contrast. CONTRAST:  23mL MULTIHANCE GADOBENATE DIMEGLUMINE 529 MG/ML IV SOLN COMPARISON:  MRI of the head May 01, 2016 FINDINGS: INTRACRANIAL CONTENTS: Stable 27 x 23 x 34 mm LEFT frontal lobe cystic mass with extremely bright reduced diffusion, low ADC value and marked surrounding FLAIR T2 hyperintense vasogenic edema. Irregular marginal enhancement with multiple contiguous  satellite foci of cystic reduced diffusion and peripheral enhancement extending to the dura with focal dural thickening and enhancement. Local sulcal effacement and mass effect without midline shift. Patchy supratentorial white matter FLAIR T2 hyperintensities. Ventricles and sulci are overall normal for patient's age. ORBITS: The included ocular globes and orbital contents are non-suspicious. SINUSES: The mastoid air-cells and included paranasal sinuses are well-aerated. SKULL/SOFT TISSUES: No abnormal sellar expansion. No suspicious calvarial bone marrow signal. Craniocervical junction maintained. 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 Findings are unlikely to represent metastatic disease. Mild chronic small vessel ischemic disease. Electronically Signed   By: Elon Alas M.D.   On: 12/31/2015 02:56   Ct Maxillofacial W/cm  01/02/2016  CLINICAL DATA:  50 year old male with syncope. Cerebral abscess. Left facial pain since this morning. Initial encounter. EXAM: CT MAXILLOFACIAL WITH CONTRAST TECHNIQUE: Multidetector CT imaging of the maxillofacial structures was performed with intravenous contrast. Multiplanar CT image reconstructions were also generated. A small metallic BB was placed on the right temple in order to reliably differentiate right from left. CONTRAST:  41mL ISOVUE-300 IOPAMIDOL (ISOVUE-300) INJECTION 61% COMPARISON:  Brain MRI 12/31/2015 and earlier. FINDINGS: Grossly stable visualized brain parenchyma. The level of the left cerebral abscess is not included. Prominent anterior left frontal lobe cortical vein is enhancing and appear stable. Calcified atherosclerosis at the skull base. Major vascular structures in the neck and at the skullbase are patent, including the left IJ. Visualized Pharynx and larynx: Negative. Mild retained secretions in the nasopharynx. Negative parapharyngeal and retropharyngeal spaces. Salivary  glands: Negative sublingual space, submandibular glands and parotid glands. Lymph nodes: No upper cervical lymphadenopathy. Orbits: No orbits soft tissue abnormality. Mastoids and visualized paranasal sinuses: Trace paranasal sinus mucosal thickening. No paranasal sinus fluid level or bubbly opacity. Bilateral tympanic cavities are clear. Bilateral mastoids are clear. Skeleton: Right greater than left frequent poor dentition. Mandible intact. No acute facial fracture. No acute osseous abnormality identified. No superficial soft tissue inflammation identified in the face. IMPRESSION: 1. No abnormality identified to explain acute facial pain. There is right greater than left poor dentition. 2. Stable visualized brain parenchyma, the level of the left frontal abscess is not included. See also Brain MRI report 12/31/2015. Electronically Signed   By: [name] M.D.   On: 01/02/2016 13:40   Dg Chest Port 1 View  12/31/2015  CLINICAL DATA:  Status post central line  placement EXAM: PORTABLE CHEST 1 VIEW COMPARISON:  12/28/2015 FINDINGS: Cardiac shadow is stable. A new right jugular central line is noted in the proximal superior vena cava. No pneumothorax is noted. No focal infiltrate or sizable effusion is seen. No bony abnormality is noted. IMPRESSION: No pneumothorax following central line placement. Electronically Signed   By: Inez Catalina M.D.   On: 12/31/2015 17:20     CBC  Recent Labs Lab 12/30/15 2009 12/31/15 0433 01/01/16 0621 01/02/16 0326 01/03/16 0401  WBC 11.7* 11.2* 17.1* 14.5* 13.7*  HGB 14.4 14.7 14.4 13.4 13.3  HCT 43.4 44.9 41.9 41.0 40.2  PLT 326 299 319 311 311  MCV 94.1 95.9 94.2 95.3 94.4  MCH 31.2 31.4 32.4 31.2 31.2  MCHC 33.2 32.7 34.4 32.7 33.1  RDW 14.2 14.3 14.1 14.2 14.3    Chemistries   Recent Labs Lab 12/30/15 2009 12/31/15 0433 01/01/16 0621 01/02/16 0326 01/03/16 0401  NA 136 138 136 140 141  K 3.6 3.8 4.5 4.1 3.8  CL 105 102 103 104 106  CO2 23 25 21* 26  24  GLUCOSE 96 96 110* 101* 97  BUN 7 7 6  <5* <5*  CREATININE 0.53* 0.75 0.64 0.63 0.57*  CALCIUM 9.0 9.2 8.7* 8.9 8.7*  AST  --   --  23 13* 13*  ALT  --   --  13* 13* 16*  ALKPHOS  --   --  92 82 86  BILITOT  --   --  2.0* 0.7 0.5   ------------------------------------------------------------------------------------------------------------------ estimated creatinine clearance is 106.9 mL/min (by C-G formula based on Cr of 0.57). ------------------------------------------------------------------------------------------------------------------ No results for input(s): HGBA1C in the last 72 hours. ------------------------------------------------------------------------------------------------------------------ No results for input(s): CHOL, HDL, LDLCALC, TRIG, CHOLHDL, LDLDIRECT in the last 72 hours. ------------------------------------------------------------------------------------------------------------------ No results for input(s): TSH, T4TOTAL, T3FREE, THYROIDAB in the last 72 hours.  Invalid input(s): FREET3 ------------------------------------------------------------------------------------------------------------------ No results for input(s): VITAMINB12, FOLATE, FERRITIN, TIBC, IRON, RETICCTPCT in the last 72 hours.  Coagulation profile  Recent Labs Lab 12/30/15 2009  INR 1.10    No results for input(s): DDIMER in the last 72 hours.  Cardiac Enzymes No results for input(s): CKMB, TROPONINI, MYOGLOBIN in the last 168 hours.  Invalid input(s): CK ------------------------------------------------------------------------------------------------------------------ Invalid input(s): POCBNP   CBG: No results for input(s): GLUCAP in the last 168 hours.     Studies: Ct Maxillofacial W/cm  01/02/2016  CLINICAL DATA:  50 year old male with syncope. Cerebral abscess. Left facial pain since this morning. Initial encounter. EXAM: CT MAXILLOFACIAL WITH CONTRAST TECHNIQUE:  Multidetector CT imaging of the maxillofacial structures was performed with intravenous contrast. Multiplanar CT image reconstructions were also generated. A small metallic BB was placed on the right temple in order to reliably differentiate right from left. CONTRAST:  4mL ISOVUE-300 IOPAMIDOL (ISOVUE-300) INJECTION 61% COMPARISON:  Brain MRI 12/31/2015 and earlier. FINDINGS: Grossly stable visualized brain parenchyma. The level of the left cerebral abscess is not included. Prominent anterior left frontal lobe cortical vein is enhancing and appear stable. Calcified atherosclerosis at the skull base. Major vascular structures in the neck and at the skullbase are patent, including the left IJ. Visualized Pharynx and larynx: Negative. Mild retained secretions in the nasopharynx. Negative parapharyngeal and retropharyngeal spaces. Salivary glands: Negative sublingual space, submandibular glands and parotid glands. Lymph nodes: No upper cervical lymphadenopathy. Orbits: No orbits soft tissue abnormality. Mastoids and visualized paranasal sinuses: Trace paranasal sinus mucosal thickening. No paranasal sinus fluid level or bubbly opacity. Bilateral tympanic cavities are clear. Bilateral mastoids  are clear. Skeleton: Right greater than left frequent poor dentition. Mandible intact. No acute facial fracture. No acute osseous abnormality identified. No superficial soft tissue inflammation identified in the face. IMPRESSION: 1. No abnormality identified to explain acute facial pain. There is right greater than left poor dentition. 2. Stable visualized brain parenchyma, the level of the left frontal abscess is not included. See also Brain MRI report 12/31/2015. Electronically Signed   By: [name] M.D.   On: 01/02/2016 13:40      Lab Results  Component Value Date   HGBA1C * 07/21/2010    6.0 (NOTE)                                                                       According to the ADA Clinical Practice  Recommendations for 2011, when HbA1c is used as a screening test:   >=6.5%   Diagnostic of Diabetes Mellitus           (if abnormal result  is confirmed)  5.7-6.4%   Increased risk of developing Diabetes Mellitus  References:Diagnosis and Classification of Diabetes Mellitus,Diabetes [id number] 1):S62-S69 and Standards of Medical Care in         Diabetes - 2011,Diabetes [id number]  (Suppl 1):S11-S61.   Lab Results  Component Value Date   LDLCALC * 07/22/2010    104        Total Cholesterol/HDL:CHD Risk Coronary Heart Disease Risk Table                     Men   Women  1/2 Average Risk   3.4   3.3  Average Risk       5.0   4.4  2 X Average Risk   9.6   7.1  3 X Average Risk  23.4   11.0        Use the calculated Patient Ratio above and the CHD Risk Table to determine the patient's CHD Risk.        ATP III CLASSIFICATION (LDL):  <100     mg/dL   Optimal  100-129  mg/dL   Near or Above                    Optimal  130-159  mg/dL   Borderline  160-189  mg/dL   High  >190     mg/dL   Very High   CREATININE 0.57* 01/03/2016       Scheduled Meds: . atorvastatin  80 mg Oral q1800  . cefTRIAXone (ROCEPHIN)  IV  2 g Intravenous Q12H  . docusate sodium  100 mg Oral BID  . feeding supplement (ENSURE ENLIVE)  237 mL Oral BID BM  . folic acid  1 mg Oral Daily  . levETIRAcetam  500 mg Intravenous Q12H  . metronidazole  500 mg Intravenous Q6H  . multivitamin with minerals  1 tablet Oral Daily  . pantoprazole (PROTONIX) IV  40 mg Intravenous QHS  . thiamine  100 mg Oral Daily   Or  . thiamine  100 mg Intravenous Daily  . vancomycin  1,250 mg Intravenous Q8H   Continuous Infusions: . 0.9 % NaCl with KCl 20 mEq / L 75 mL/hr at 01/03/16 0700  LOS: 4 days    Time spent: >30 MINS    [facility] Pager [phone]. If 7PM-7AM, please contact night-coverage at [email or web address], password [facility] 01/03/2016, 2:09 PM  LOS: 4 days

## 2016-01-03 NOTE — Progress Notes (Signed)
Physical medicine rehabilitation consult requested chart reviewed. Physical and occupational therapy evaluations completed with recommendations documented for outpatient therapies. Patient currently at supervision level ambulation without assistive device. Patient will not need inpatient rehabilitation services at this time. Recommendations are discharged to home with outpatient therapies.

## 2016-01-03 NOTE — Progress Notes (Signed)
Occupational Therapy Treatment Patient Details Name: Jeffery Prince Prince MRN: [id number] DOB: 1966/06/09 Today's Date: 01/03/2016    History of present illness This 50 y.o. male transferred from [place] after LOC.  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  He underwetn Lt frontal craniotomy 12/31/15.   PMH includes:  ischemic cardiomyopathy, MI, CAD, HTN, tobacco abuse.   OT comments  Pt with improved ability to perform simple ADLs.  Sequencing significantly improved, but still needs min cues.  He demonstrates impaired visual scanning and Rt inattention which is more pronounced as he fatigues.   He requires supervision.  Recommend 24 hour supervision at discharge.  IF sister unable to provide necessary level of assist,  He may need SNF.   Follow Up Recommendations  Outpatient OT;Supervision/Assistance - 24 hour    Equipment Recommendations  None recommended by OT    Recommendations for Other Services      Precautions / Restrictions Precautions Precautions: Fall Restrictions Weight Bearing Restrictions: No       Mobility Bed Mobility Overal bed mobility: Independent                Transfers Overall transfer level: Modified independent                    Balance Overall balance assessment: No apparent balance deficits (not formally assessed)                                 ADL Overall ADL's : Needs assistance/impaired Eating/Feeding: Independent   Grooming: Wash/dry hands;Wash/dry face;Oral care;Supervision/safety;Standing Grooming Details (indicate cue type and reason): Pt requires increased time, and one cue for sequencing today                  Toilet Transfer: Supervision/safety   Toileting- Water quality scientist and Hygiene: Supervision/safety;Sit to/from stand       Functional mobility during ADLs: Independent        Vision                 Additional Comments: Pt able read room numbers  while ambulating with min cues.  He required max cues to visually scan buletin board to locate pictures (structured array of items).  Spoke with RN who is familiar with pt.  She reports pt was definitively neglecting Rt side earlier in the week   Perception     Praxis      Cognition   Behavior During Therapy: Flat affect Overall Cognitive Status: Impaired/Different from baseline Area of Impairment: Attention;Following commands;Problem solving   Current Attention Level: Selective (with min cues ) Memory: Decreased short-term memory  Following Commands: Follows multi-step commands inconsistently     Problem Solving: Slow processing;Difficulty sequencing;Requires verbal cues General Comments: Sequencing improved during brushing teeth, but does hesitate and requires increased time.  He does well for a while, then will loose his attention and requires cues to redirect to activity.  Pt able to recall and locate his room number     Extremity/Trunk Assessment               Exercises     Shoulder Instructions       General Comments      Pertinent Vitals/ Pain       Pain Assessment: Faces Faces Pain Scale: No hurt  Home Living  Prior Functioning/Environment              Frequency Min 2X/week     Progress Toward Goals  OT Goals(current goals can now be found in the care plan section)  Progress towards OT goals: Progressing toward goals  ADL Goals Pt Will Perform Grooming: with modified independence;standing Pt Will Perform Upper Body Bathing: with modified independence Pt Will Perform Lower Body Bathing: with modified independence;sit to/from stand Pt Will Perform Upper Body Dressing: with modified independence;sitting;standing Pt Will Perform Lower Body Dressing: with modified independence;sit to/from stand Pt Will Transfer to Toilet: with modified independence;ambulating;regular height toilet Pt Will  Perform Toileting - Clothing Manipulation and hygiene: with modified independence;sit to/from stand Additional ADL Goal #1: Pt will locate items on Rt consistently with min verbal cues  Additional ADL Goal #2: Pt will be able to sequence through familiar ADL tasks with no cues   Plan Discharge plan needs to be updated    Co-evaluation                 End of Session     Activity Tolerance Patient tolerated treatment well   Patient Left in bed;with call bell/phone within reach   Nurse Communication Mobility status        Time: [id number] OT Time Calculation (min): 32 min  Charges: OT General Charges $OT Visit: 1 Procedure OT Treatments $Therapeutic Activity: 23-37 mins  [name] M 01/03/2016, 11:50 AM

## 2016-01-04 DIAGNOSIS — I10 Essential (primary) hypertension: Secondary | ICD-10-CM

## 2016-01-04 DIAGNOSIS — G060 Intracranial abscess and granuloma: Secondary | ICD-10-CM

## 2016-01-04 LAB — VITAMIN B1: VITAMIN B1 (THIAMINE): 219.9 nmol/L — AB (ref 66.5–200.0)

## 2016-01-04 NOTE — Progress Notes (Signed)
Patient ID: Jeffery Prince, male   DOB: 1966/06/22, 50 y.o.   MRN: [id number] Stable, no headache. Eating.

## 2016-01-04 NOTE — Progress Notes (Signed)
[facility] PROGRESS NOTE  Jeffery Prince [id number] DOB: 1965-11-29 DOA: 12/30/2015   PCP: No primary care provider on file.     Assessment/Plan: Principal Problem:   Brain mass Active Problems:   Alcohol abuse   CORONARY ATHEROSCLEROSIS NATIVE CORONARY ARTERY   HTN (hypertension)   Brain tumor ([place])   Jeffery Prince is a 50 y.o. male with medical history significant of CAD s/p stent in 2012, has been off of his meds due to inability to afford for past 5 months. Patient presented to [facility] after an episode of apparent syncope that lasted 10 mins. He was admitted to [facility], serial cardiac enzymes were negative, cardiac stress test was negative. CT scan was performed in the ED at the time of admission and demonstrated apparent L frontal lobe mass. MRI was performed which suggested a ring enhancing abscess vs mass. He was transferred to [facility] for neurosurgical consult. 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 Started on vancomycin, ceftriaxone, Flagyl   for brain abscess. Patient needs 6-8 weeks of IV antibiotics. Blood cultures drawn 5/16, no growth so far.  Started on [place] for seizure prophylaxis, syncope possibly related to a seizure.  Received 1gm load IV then 500mg  IV BID keppra.HIV antibody nonreactive.. 2-D echo to rule out embolic source for the brain abscess negative,   Patient will need repeat neuro  imaging. Infectious disease recommended to rule out dental cavity/abscess, no Panorex available therefore ordered a CT maxillofacialwhich did not show any dental abscess.  -expressive aphasia  which is improving. -does not have 24 hour care at home-- Social worker consulted  H/o EtOH abuse -continue   thiamine and folic acid  H/o CAD - Continue statin, hold off on  aspirin Negative stress test at [facility] today based on their transfer documentation (EF 51%), so should be clear from a cardiac perspective for surgery (which isnt really elective anyhow).  HTN - not currently taking home meds, will hold off on starting any new meds at the moment    Code Status:  Full code     Family Communication: no family at bedside  Disposition Plan:  Per primary     Consultants:  Neurosurgery  Infectious disease  Inpatient rehabilitation  Procedures: Left frontal craniotomy for evacuation of brain abscess   Antibiotics: Rocephin 5/16 Vancomycin 5/16 Flagyl 5/17        HPI/Subjective: No complaints today Slow to respond  Objective: Filed Vitals:   01/03/16 2152 01/04/16 0208 01/04/16 0518 01/04/16 0950  BP: 136/85 135/70 113/75 142/88  Pulse: 69 68 57 63  Temp: 99.3 F (37.4 C) 98.6 F (37 C) 97.6 F (36.4 C) 98.9 F (37.2 C)  TempSrc: Oral Oral Oral Oral  Resp: 18 17 16 18   Height:      Weight:      SpO2: 97% 99% 97% 98%    Intake/Output Summary (Last 24 hours) at 01/04/16 1138 Last data filed at 01/04/16 1115  Gross per 24 hour  Intake   1030 ml  Output   2640 ml  Net  -1610 ml    Exam:  Examination:  General exam: Appears calm and comfortable  Respiratory system: Clear to auscultation. Respiratory effort normal. Cardiovascular system: S1 & S2 heard, RRR. No JVD, murmurs, rubs, gallops or clicks. No pedal edema. Gastrointestinal system: Abdomen is nondistended, soft  and nontender. No organomegaly or masses felt. Normal bowel sounds heard.     Data Reviewed: I have personally reviewed following labs and imaging studies  Micro Results Recent Results (from the past 240 hour(s))  MRSA PCR Screening     Status: None   Collection Time: 12/30/15  5:51 PM  Result Value Ref Range Status   MRSA by PCR NEGATIVE NEGATIVE Final    Comment:        The GeneXpert MRSA Assay (FDA approved for  NASAL specimens only), is one component of a comprehensive MRSA colonization surveillance program. It is not intended to diagnose MRSA infection nor to guide or monitor treatment for MRSA infections.   Anaerobic culture     Status: None (Preliminary result)   Collection Time: 12/31/15  3:52 PM  Result Value Ref Range Status   Specimen Description ABSCESS  Final   Special Requests DRAINAGE FROM BRAIN  Final   Gram Stain   Final    FEW WBC PRESENT, PREDOMINANTLY PMN NO SQUAMOUS EPITHELIAL CELLS SEEN NO ORGANISMS SEEN Performed at [facility] Performed at [facility]    Culture   Final    NO ANAEROBES ISOLATED; CULTURE IN PROGRESS FOR 5 DAYS Performed at [facility]    Report Status PENDING  Incomplete  Gram stain     Status: None   Collection Time: 12/31/15  3:52 PM  Result Value Ref Range Status   Specimen Description ABSCESS  Final   Special Requests DRAINAGE FROM BRAIN  Final   Gram Stain   Final    FEW WBC PRESENT, PREDOMINANTLY PMN NO ORGANISMS SEEN Gram Stain Report Called to,Read Back By and Verified With:    Report Status 12/31/2015 FINAL  Final  Culture, routine-abscess     Status: None   Collection Time: 12/31/15  3:52 PM  Result Value Ref Range Status   Specimen Description ABSCESS  Final   Special Requests DRAINAGE FROM BRAIN  Final   Gram Stain   Final    FEW WBC PRESENT, PREDOMINANTLY PMN NO SQUAMOUS EPITHELIAL CELLS SEEN NO ORGANISMS SEEN Performed at [facility] Performed at [facility]    Culture   Final    NO GROWTH 2 DAYS Performed at [facility]    Report Status 01/03/2016 FINAL  Final  Culture, blood (routine x 2)     Status: None (Preliminary result)   Collection Time: 12/31/15  8:04 PM  Result Value Ref Range Status   Specimen Description BLOOD CENTRAL LINE  Final   Special Requests BOTTLES DRAWN AEROBIC AND ANAEROBIC 5CC  Final   Culture NO GROWTH 4 DAYS  Final   Report Status PENDING   Incomplete  Culture, blood (routine x 2)     Status: None (Preliminary result)   Collection Time: 12/31/15  8:10 PM  Result Value Ref Range Status   Specimen Description BLOOD LEFT ARM  Final   Special Requests BOTTLES DRAWN AEROBIC AND ANAEROBIC 5CC  Final   Culture NO GROWTH 4 DAYS  Final   Report Status PENDING  Incomplete  MRSA PCR Screening     Status: None   Collection Time: 01/01/16 11:44 AM  Result Value Ref Range Status   MRSA by PCR NEGATIVE NEGATIVE Final    Comment:        The GeneXpert MRSA Assay (FDA approved for NASAL specimens only), is one component of a comprehensive MRSA colonization surveillance program. It is not intended to diagnose MRSA  infection nor to guide or monitor treatment for MRSA infections.     Radiology Reports Mr Jeri Cos Wo Contrast  12/31/2015  CLINICAL DATA:  Syncopal episode today. Follow-up brain tumor versus abscess. EXAM: MRI HEAD WITHOUT AND WITH CONTRAST TECHNIQUE: Multiplanar, multiecho pulse sequences of the brain and surrounding structures were obtained without and with intravenous contrast. CONTRAST:  83mL MULTIHANCE GADOBENATE DIMEGLUMINE 529 MG/ML IV SOLN COMPARISON:  MRI of the head May 01, 2016 FINDINGS: INTRACRANIAL CONTENTS: Stable 27 x 23 x 34 mm LEFT frontal lobe cystic mass with extremely bright reduced diffusion, low ADC value and marked surrounding FLAIR T2 hyperintense vasogenic edema. Irregular marginal enhancement with multiple contiguous satellite foci of cystic reduced diffusion and peripheral enhancement extending to the dura with focal dural thickening and enhancement. Local sulcal effacement and mass effect without midline shift. Patchy supratentorial white matter FLAIR T2 hyperintensities. Ventricles and sulci are overall normal for patient's age. ORBITS: The included ocular globes and orbital contents are non-suspicious. SINUSES: The mastoid air-cells and included paranasal sinuses are well-aerated. SKULL/SOFT  TISSUES: No abnormal sellar expansion. No suspicious calvarial bone marrow signal. Craniocervical junction maintained. 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 Findings are unlikely to represent metastatic disease. Mild chronic small vessel ischemic disease. Electronically Signed   By: Elon Alas M.D.   On: 12/31/2015 02:56   Ct Maxillofacial W/cm  01/02/2016  CLINICAL DATA:  50 year old male with syncope. Cerebral abscess. Left facial pain since this morning. Initial encounter. EXAM: CT MAXILLOFACIAL WITH CONTRAST TECHNIQUE: Multidetector CT imaging of the maxillofacial structures was performed with intravenous contrast. Multiplanar CT image reconstructions were also generated. A small metallic BB was placed on the right temple in order to reliably differentiate right from left. CONTRAST:  68mL ISOVUE-300 IOPAMIDOL (ISOVUE-300) INJECTION 61% COMPARISON:  Brain MRI 12/31/2015 and earlier. FINDINGS: Grossly stable visualized brain parenchyma. The level of the left cerebral abscess is not included. Prominent anterior left frontal lobe cortical vein is enhancing and appear stable. Calcified atherosclerosis at the skull base. Major vascular structures in the neck and at the skullbase are patent, including the left IJ. Visualized Pharynx and larynx: Negative. Mild retained secretions in the nasopharynx. Negative parapharyngeal and retropharyngeal spaces. Salivary glands: Negative sublingual space, submandibular glands and parotid glands. Lymph nodes: No upper cervical lymphadenopathy. Orbits: No orbits soft tissue abnormality. Mastoids and visualized paranasal sinuses: Trace paranasal sinus mucosal thickening. No paranasal sinus fluid level or bubbly opacity. Bilateral tympanic cavities are clear. Bilateral mastoids are clear. Skeleton: Right greater than left frequent poor dentition. Mandible intact. No acute facial fracture. No acute  osseous abnormality identified. No superficial soft tissue inflammation identified in the face. IMPRESSION: 1. No abnormality identified to explain acute facial pain. There is right greater than left poor dentition. 2. Stable visualized brain parenchyma, the level of the left frontal abscess is not included. See also Brain MRI report 12/31/2015. Electronically Signed   By: [name] M.D.   On: 01/02/2016 13:40   Dg Chest Port 1 View  12/31/2015  CLINICAL DATA:  Status post central line placement EXAM: PORTABLE CHEST 1 VIEW COMPARISON:  12/28/2015 FINDINGS: Cardiac shadow is stable. A new right jugular central line is noted in the proximal superior vena cava. No pneumothorax is noted. No focal infiltrate or sizable effusion is seen. No bony abnormality is noted. IMPRESSION: No pneumothorax following central line placement. Electronically Signed   By: Linus Mako.D.  On: 12/31/2015 17:20     CBC  Recent Labs Lab 12/30/15 2009 12/31/15 0433 01/01/16 0621 01/02/16 0326 01/03/16 0401  WBC 11.7* 11.2* 17.1* 14.5* 13.7*  HGB 14.4 14.7 14.4 13.4 13.3  HCT 43.4 44.9 41.9 41.0 40.2  PLT 326 299 319 311 311  MCV 94.1 95.9 94.2 95.3 94.4  MCH 31.2 31.4 32.4 31.2 31.2  MCHC 33.2 32.7 34.4 32.7 33.1  RDW 14.2 14.3 14.1 14.2 14.3    Chemistries   Recent Labs Lab 12/30/15 2009 12/31/15 0433 01/01/16 0621 01/02/16 0326 01/03/16 0401  NA 136 138 136 140 141  K 3.6 3.8 4.5 4.1 3.8  CL 105 102 103 104 106  CO2 23 25 21* 26 24  GLUCOSE 96 96 110* 101* 97  BUN 7 7 6  <5* <5*  CREATININE 0.53* 0.75 0.64 0.63 0.57*  CALCIUM 9.0 9.2 8.7* 8.9 8.7*  AST  --   --  23 13* 13*  ALT  --   --  13* 13* 16*  ALKPHOS  --   --  92 82 86  BILITOT  --   --  2.0* 0.7 0.5   ------------------------------------------------------------------------------------------------------------------ estimated creatinine clearance is 106.9 mL/min (by C-G formula based on Cr of  0.57). ------------------------------------------------------------------------------------------------------------------ No results for input(s): HGBA1C in the last 72 hours. ------------------------------------------------------------------------------------------------------------------ No results for input(s): CHOL, HDL, LDLCALC, TRIG, CHOLHDL, LDLDIRECT in the last 72 hours. ------------------------------------------------------------------------------------------------------------------ No results for input(s): TSH, T4TOTAL, T3FREE, THYROIDAB in the last 72 hours.  Invalid input(s): FREET3 ------------------------------------------------------------------------------------------------------------------ No results for input(s): VITAMINB12, FOLATE, FERRITIN, TIBC, IRON, RETICCTPCT in the last 72 hours.  Coagulation profile  Recent Labs Lab 12/30/15 2009  INR 1.10    No results for input(s): DDIMER in the last 72 hours.  Cardiac Enzymes No results for input(s): CKMB, TROPONINI, MYOGLOBIN in the last 168 hours.  Invalid input(s): CK ------------------------------------------------------------------------------------------------------------------ Invalid input(s): POCBNP   CBG: No results for input(s): GLUCAP in the last 168 hours.     Studies: Ct Maxillofacial W/cm  01/02/2016  CLINICAL DATA:  50 year old male with syncope. Cerebral abscess. Left facial pain since this morning. Initial encounter. EXAM: CT MAXILLOFACIAL WITH CONTRAST TECHNIQUE: Multidetector CT imaging of the maxillofacial structures was performed with intravenous contrast. Multiplanar CT image reconstructions were also generated. A small metallic BB was placed on the right temple in order to reliably differentiate right from left. CONTRAST:  26mL ISOVUE-300 IOPAMIDOL (ISOVUE-300) INJECTION 61% COMPARISON:  Brain MRI 12/31/2015 and earlier. FINDINGS: Grossly stable visualized brain parenchyma. The level of the  left cerebral abscess is not included. Prominent anterior left frontal lobe cortical vein is enhancing and appear stable. Calcified atherosclerosis at the skull base. Major vascular structures in the neck and at the skullbase are patent, including the left IJ. Visualized Pharynx and larynx: Negative. Mild retained secretions in the nasopharynx. Negative parapharyngeal and retropharyngeal spaces. Salivary glands: Negative sublingual space, submandibular glands and parotid glands. Lymph nodes: No upper cervical lymphadenopathy. Orbits: No orbits soft tissue abnormality. Mastoids and visualized paranasal sinuses: Trace paranasal sinus mucosal thickening. No paranasal sinus fluid level or bubbly opacity. Bilateral tympanic cavities are clear. Bilateral mastoids are clear. Skeleton: Right greater than left frequent poor dentition. Mandible intact. No acute facial fracture. No acute osseous abnormality identified. No superficial soft tissue inflammation identified in the face. IMPRESSION: 1. No abnormality identified to explain acute facial pain. There is right greater than left poor dentition. 2. Stable visualized brain parenchyma, the level of the left frontal abscess is not included.  See also Brain MRI report 12/31/2015. Electronically Signed   By: [name] M.D.   On: 01/02/2016 13:40      Lab Results  Component Value Date   HGBA1C * 07/21/2010    6.0 (NOTE)                                                                       According to the ADA Clinical Practice Recommendations for 2011, when HbA1c is used as a screening test:   >=6.5%   Diagnostic of Diabetes Mellitus           (if abnormal result  is confirmed)  5.7-6.4%   Increased risk of developing Diabetes Mellitus  References:Diagnosis and Classification of Diabetes Mellitus,Diabetes [id number] 1):S62-S69 and Standards of Medical Care in         Diabetes - 2011,Diabetes [id number]  (Suppl 1):S11-S61.   Lab Results  Component Value Date    LDLCALC * 07/22/2010    104        Total Cholesterol/HDL:CHD Risk Coronary Heart Disease Risk Table                     Men   Women  1/2 Average Risk   3.4   3.3  Average Risk       5.0   4.4  2 X Average Risk   9.6   7.1  3 X Average Risk  23.4   11.0        Use the calculated Patient Ratio above and the CHD Risk Table to determine the patient's CHD Risk.        ATP III CLASSIFICATION (LDL):  <100     mg/dL   Optimal  100-129  mg/dL   Near or Above                    Optimal  130-159  mg/dL   Borderline  160-189  mg/dL   High  >190     mg/dL   Very High   CREATININE 0.57* 01/03/2016       Scheduled Meds: . atorvastatin  80 mg Oral q1800  . cefTRIAXone (ROCEPHIN)  IV  2 g Intravenous Q12H  . docusate sodium  100 mg Oral BID  . feeding supplement (ENSURE ENLIVE)  237 mL Oral BID BM  . folic acid  1 mg Oral Daily  . levETIRAcetam  500 mg Oral BID  . metronidazole  500 mg Intravenous Q6H  . multivitamin with minerals  1 tablet Oral Daily  . pantoprazole  40 mg Oral Daily  . thiamine  100 mg Oral Daily  . vancomycin  1,250 mg Intravenous Q8H   Continuous Infusions:     LOS: 5 days    Time spent: [facility] Pager [phone]. If 7PM-7AM, please contact night-coverage at [email or web address], password [facility] 01/04/2016, 11:38 AM  LOS: 5 days

## 2016-01-05 DIAGNOSIS — F17200 Nicotine dependence, unspecified, uncomplicated: Secondary | ICD-10-CM

## 2016-01-05 LAB — ANAEROBIC CULTURE

## 2016-01-05 LAB — CBC
HEMATOCRIT: 41.1 % (ref 39.0–52.0)
Hemoglobin: 13.7 g/dL (ref 13.0–17.0)
MCH: 31.1 pg (ref 26.0–34.0)
MCHC: 33.3 g/dL (ref 30.0–36.0)
MCV: 93.4 fL (ref 78.0–100.0)
Platelets: 385 10*3/uL (ref 150–400)
RBC: 4.4 MIL/uL (ref 4.22–5.81)
RDW: 14 % (ref 11.5–15.5)
WBC: 10.6 10*3/uL — AB (ref 4.0–10.5)

## 2016-01-05 LAB — CULTURE, BLOOD (ROUTINE X 2)
Culture: NO GROWTH
Culture: NO GROWTH

## 2016-01-05 LAB — GLUCOSE, CAPILLARY: Glucose-Capillary: 109 mg/dL — ABNORMAL HIGH (ref 65–99)

## 2016-01-05 LAB — BASIC METABOLIC PANEL
ANION GAP: 11 (ref 5–15)
BUN: 6 mg/dL (ref 6–20)
CHLORIDE: 103 mmol/L (ref 101–111)
CO2: 25 mmol/L (ref 22–32)
Calcium: 9.2 mg/dL (ref 8.9–10.3)
Creatinine, Ser: 0.58 mg/dL — ABNORMAL LOW (ref 0.61–1.24)
GFR calc Af Amer: >60 mL/min (ref >60)
GFR calc non Af Amer: >60 mL/min (ref >60)
GLUCOSE: 99 mg/dL (ref 65–99)
POTASSIUM: 3.9 mmol/L (ref 3.5–5.1)
Sodium: 139 mmol/L (ref 135–145)

## 2016-01-05 LAB — VANCOMYCIN, TROUGH: Vancomycin Tr: 20 ug/mL (ref 10.0–20.0)

## 2016-01-05 NOTE — Progress Notes (Addendum)
[facility] PROGRESS NOTE  Jeffery Prince [id number] DOB: [birth year] DOA: 12/30/2015   PCP: No primary care provider on file.     Assessment/Plan: Active Problems:   Alcohol abuse   TOBACCO USER   CORONARY ATHEROSCLEROSIS NATIVE CORONARY ARTERY   HTN (hypertension)   Brain abscess   Jeffery Prince is a 50 y.o. male with medical history significant of CAD s/p stent in 2012, has been off of his meds due to inability to afford for past 5 months. Patient presented to [facility] after an episode of apparent syncope that lasted 10 mins. He was admitted to [facility], serial cardiac enzymes were negative, cardiac stress test was negative. CT scan was performed in the ED at the time of admission and demonstrated apparent L frontal lobe mass. MRI was performed which suggested a ring enhancing abscess vs mass. He was transferred to [facility] for neurosurgical consult. 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    Assessment and plan  Brain abscess- -abscess: status post craniotomy - vancomycin, ceftriaxone, Flagyl   for brain abscess -IV abx until 6/30 -Keppra for seizure prophylaxis, syncope possibly related to a seizure.  -HIV antibody nonreactive -2-D echo to rule out embolic source for the brain abscess negative,    - Infectious disease recommended to rule out dental cavity/abscess, no Panorex available therefore ordered a CT maxillofacialwhich did not show any dental abscess.  -expressive aphasia  which is improving. -does not have 24 hour care at home-- Social worker consulted for IV abs until 6/30  H/o EtOH abuse -continue thiamine and folic acid  H/o CAD - Continue statin, hold off on aspirin   HTN - not currently taking home meds, will hold off on starting any new meds at the moment    Code Status:  Full code     Family Communication: no family at  bedside  Disposition Plan:  Per primary     Consultants:  Neurosurgery  Infectious disease  Inpatient rehabilitation- not a candidate  Procedures: Left frontal craniotomy for evacuation of brain abscess   Antibiotics: Rocephin 5/16 Vancomycin 5/16 Flagyl 5/17        HPI/Subjective: No headache  Objective: Filed Vitals:   01/04/16 2124 01/05/16 0106 01/05/16 0552 01/05/16 0946  BP: 136/74 107/70 120/82 109/69  Pulse: 65 67 63 69  Temp: 98.7 F (37.1 C) 98.5 F (36.9 C) 98.2 F (36.8 C) 98.9 F (37.2 C)  TempSrc: Oral Oral Oral Oral  Resp: 16 18 16 18   Height:      Weight:      SpO2: 97% 99% 98% 98%    Intake/Output Summary (Last 24 hours) at 01/05/16 1342 Last data filed at 01/05/16 1100  Gross per 24 hour  Intake    120 ml  Output   2200 ml  Net  -2080 ml    Exam:  Examination:  General exam: Appears calm and comfortable  Respiratory system: Clear to auscultation. Respiratory effort normal. Cardiovascular system: S1 & S2 heard, RRR. No JVD, murmurs, rubs, gallops or clicks. No pedal edema. Gastrointestinal system: Abdomen is nondistended, soft and nontender. No organomegaly or masses felt. Normal bowel sounds heard.     Data Reviewed: I have personally reviewed following labs and imaging studies  Micro Results Recent Results (from the past 240 hour(s))  MRSA PCR Screening     Status: None   Collection Time: 12/30/15  5:51 PM  Result Value  Ref Range Status   MRSA by PCR NEGATIVE NEGATIVE Final    Comment:        The GeneXpert MRSA Assay (FDA approved for NASAL specimens only), is one component of a comprehensive MRSA colonization surveillance program. It is not intended to diagnose MRSA infection nor to guide or monitor treatment for MRSA infections.   Anaerobic culture     Status: None   Collection Time: 12/31/15  3:52 PM  Result Value Ref Range Status   Specimen Description ABSCESS  Final   Special Requests DRAINAGE FROM  BRAIN  Final   Gram Stain   Final    FEW WBC PRESENT, PREDOMINANTLY PMN NO SQUAMOUS EPITHELIAL CELLS SEEN NO ORGANISMS SEEN Performed at [facility] Performed at [facility]    Culture   Final    NO ANAEROBES ISOLATED Performed at [facility]    Report Status 01/05/2016 FINAL  Final  Gram stain     Status: None   Collection Time: 12/31/15  3:52 PM  Result Value Ref Range Status   Specimen Description ABSCESS  Final   Special Requests DRAINAGE FROM BRAIN  Final   Gram Stain   Final    FEW WBC PRESENT, PREDOMINANTLY PMN NO ORGANISMS SEEN Gram Stain Report Called to,Read Back By and Verified With:    Report Status 12/31/2015 FINAL  Final  Culture, routine-abscess     Status: None   Collection Time: 12/31/15  3:52 PM  Result Value Ref Range Status   Specimen Description ABSCESS  Final   Special Requests DRAINAGE FROM BRAIN  Final   Gram Stain   Final    FEW WBC PRESENT, PREDOMINANTLY PMN NO SQUAMOUS EPITHELIAL CELLS SEEN NO ORGANISMS SEEN Performed at [facility] Performed at [facility]    Culture   Final    NO GROWTH 2 DAYS Performed at [facility]    Report Status 01/03/2016 FINAL  Final  Culture, blood (routine x 2)     Status: None   Collection Time: 12/31/15  8:04 PM  Result Value Ref Range Status   Specimen Description BLOOD CENTRAL LINE  Final   Special Requests BOTTLES DRAWN AEROBIC AND ANAEROBIC 5CC  Final   Culture NO GROWTH 5 DAYS  Final   Report Status 01/05/2016 FINAL  Final  Culture, blood (routine x 2)     Status: None   Collection Time: 12/31/15  8:10 PM  Result Value Ref Range Status   Specimen Description BLOOD LEFT ARM  Final   Special Requests BOTTLES DRAWN AEROBIC AND ANAEROBIC 5CC  Final   Culture NO GROWTH 5 DAYS  Final   Report Status 01/05/2016 FINAL  Final  MRSA PCR Screening     Status: None   Collection Time: 01/01/16 11:44 AM  Result Value Ref Range Status   MRSA by PCR NEGATIVE  NEGATIVE Final    Comment:        The GeneXpert MRSA Assay (FDA approved for NASAL specimens only), is one component of a comprehensive MRSA colonization surveillance program. It is not intended to diagnose MRSA infection nor to guide or monitor treatment for MRSA infections.     Radiology Reports Mr Jeri Cos Wo Contrast  12/31/2015  CLINICAL DATA:  Syncopal episode today. Follow-up brain tumor versus abscess. EXAM: MRI HEAD WITHOUT AND WITH CONTRAST TECHNIQUE: Multiplanar, multiecho pulse sequences of the brain and surrounding structures were obtained without and with intravenous contrast. CONTRAST:  65mL MULTIHANCE GADOBENATE DIMEGLUMINE  529 MG/ML IV SOLN COMPARISON:  MRI of the head May 01, 2016 FINDINGS: INTRACRANIAL CONTENTS: Stable 27 x 23 x 34 mm LEFT frontal lobe cystic mass with extremely bright reduced diffusion, low ADC value and marked surrounding FLAIR T2 hyperintense vasogenic edema. Irregular marginal enhancement with multiple contiguous satellite foci of cystic reduced diffusion and peripheral enhancement extending to the dura with focal dural thickening and enhancement. Local sulcal effacement and mass effect without midline shift. Patchy supratentorial white matter FLAIR T2 hyperintensities. Ventricles and sulci are overall normal for patient's age. ORBITS: The included ocular globes and orbital contents are non-suspicious. SINUSES: The mastoid air-cells and included paranasal sinuses are well-aerated. SKULL/SOFT TISSUES: No abnormal sellar expansion. No suspicious calvarial bone marrow signal. Craniocervical junction maintained. 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 Findings are unlikely to represent metastatic disease. Mild chronic small vessel ischemic disease. Electronically Signed   By: Elon Alas M.D.   On: 12/31/2015 02:56   Ct Maxillofacial W/cm  01/02/2016  CLINICAL DATA:  50 year old  male with syncope. Cerebral abscess. Left facial pain since this morning. Initial encounter. EXAM: CT MAXILLOFACIAL WITH CONTRAST TECHNIQUE: Multidetector CT imaging of the maxillofacial structures was performed with intravenous contrast. Multiplanar CT image reconstructions were also generated. A small metallic BB was placed on the right temple in order to reliably differentiate right from left. CONTRAST:  40mL ISOVUE-300 IOPAMIDOL (ISOVUE-300) INJECTION 61% COMPARISON:  Brain MRI 12/31/2015 and earlier. FINDINGS: Grossly stable visualized brain parenchyma. The level of the left cerebral abscess is not included. Prominent anterior left frontal lobe cortical vein is enhancing and appear stable. Calcified atherosclerosis at the skull base. Major vascular structures in the neck and at the skullbase are patent, including the left IJ. Visualized Pharynx and larynx: Negative. Mild retained secretions in the nasopharynx. Negative parapharyngeal and retropharyngeal spaces. Salivary glands: Negative sublingual space, submandibular glands and parotid glands. Lymph nodes: No upper cervical lymphadenopathy. Orbits: No orbits soft tissue abnormality. Mastoids and visualized paranasal sinuses: Trace paranasal sinus mucosal thickening. No paranasal sinus fluid level or bubbly opacity. Bilateral tympanic cavities are clear. Bilateral mastoids are clear. Skeleton: Right greater than left frequent poor dentition. Mandible intact. No acute facial fracture. No acute osseous abnormality identified. No superficial soft tissue inflammation identified in the face. IMPRESSION: 1. No abnormality identified to explain acute facial pain. There is right greater than left poor dentition. 2. Stable visualized brain parenchyma, the level of the left frontal abscess is not included. See also Brain MRI report 12/31/2015. Electronically Signed   By: [name] M.D.   On: 01/02/2016 13:40   Dg Chest Port 1 View  12/31/2015  CLINICAL DATA:  Status  post central line placement EXAM: PORTABLE CHEST 1 VIEW COMPARISON:  12/28/2015 FINDINGS: Cardiac shadow is stable. A new right jugular central line is noted in the proximal superior vena cava. No pneumothorax is noted. No focal infiltrate or sizable effusion is seen. No bony abnormality is noted. IMPRESSION: No pneumothorax following central line placement. Electronically Signed   By: Inez Catalina M.D.   On: 12/31/2015 17:20     CBC  Recent Labs Lab 12/31/15 0433 01/01/16 0621 01/02/16 0326 01/03/16 0401 01/05/16 0634  WBC 11.2* 17.1* 14.5* 13.7* 10.6*  HGB 14.7 14.4 13.4 13.3 13.7  HCT 44.9 41.9 41.0 40.2 41.1  PLT 299 319 311 311 385  MCV 95.9 94.2 95.3 94.4 93.4  MCH 31.4 32.4 31.2 31.2 31.1  MCHC 32.7 34.4 32.7 33.1 33.3  RDW 14.3 14.1 14.2 14.3 14.0    Chemistries   Recent Labs Lab 12/31/15 0433 01/01/16 0621 01/02/16 0326 01/03/16 0401 01/05/16 0634  NA 138 136 140 141 139  K 3.8 4.5 4.1 3.8 3.9  CL 102 103 104 106 103  CO2 25 21* 26 24 25   GLUCOSE 96 110* 101* 97 99  BUN 7 6 <5* <5* 6  CREATININE 0.75 0.64 0.63 0.57* 0.58*  CALCIUM 9.2 8.7* 8.9 8.7* 9.2  AST  --  23 13* 13*  --   ALT  --  13* 13* 16*  --   ALKPHOS  --  92 82 86  --   BILITOT  --  2.0* 0.7 0.5  --    ------------------------------------------------------------------------------------------------------------------ estimated creatinine clearance is 106.9 mL/min (by C-G formula based on Cr of 0.58). ------------------------------------------------------------------------------------------------------------------ No results for input(s): HGBA1C in the last 72 hours. ------------------------------------------------------------------------------------------------------------------ No results for input(s): CHOL, HDL, LDLCALC, TRIG, CHOLHDL, LDLDIRECT in the last 72 hours. ------------------------------------------------------------------------------------------------------------------ No results for  input(s): TSH, T4TOTAL, T3FREE, THYROIDAB in the last 72 hours.  Invalid input(s): FREET3 ------------------------------------------------------------------------------------------------------------------ No results for input(s): VITAMINB12, FOLATE, FERRITIN, TIBC, IRON, RETICCTPCT in the last 72 hours.  Coagulation profile  Recent Labs Lab 12/30/15 2009  INR 1.10    No results for input(s): DDIMER in the last 72 hours.  Cardiac Enzymes No results for input(s): CKMB, TROPONINI, MYOGLOBIN in the last 168 hours.  Invalid input(s): CK ------------------------------------------------------------------------------------------------------------------ Invalid input(s): POCBNP   CBG:  Recent Labs Lab 01/05/16 1115  GLUCAP 109*       Studies: No results found.    Lab Results  Component Value Date   HGBA1C * 07/21/2010    6.0 (NOTE)                                                                       According to the ADA Clinical Practice Recommendations for 2011, when HbA1c is used as a screening test:   >=6.5%   Diagnostic of Diabetes Mellitus           (if abnormal result  is confirmed)  5.7-6.4%   Increased risk of developing Diabetes Mellitus  References:Diagnosis and Classification of Diabetes Mellitus,Diabetes [id number] 1):S62-S69 and Standards of Medical Care in         Diabetes - 2011,Diabetes Care,2011,34  (Suppl 1):S11-S61.   Lab Results  Component Value Date   LDLCALC * 07/22/2010    104        Total Cholesterol/HDL:CHD Risk Coronary Heart Disease Risk Table                     Men   Women  1/2 Average Risk   3.4   3.3  Average Risk       5.0   4.4  2 X Average Risk   9.6   7.1  3 X Average Risk  23.4   11.0        Use the calculated Patient Ratio above and the CHD Risk Table to determine the patient's CHD Risk.        ATP III CLASSIFICATION (LDL):  <100     mg/dL   Optimal  100-129  mg/dL   Near or Above                    Optimal   130-159  mg/dL   Borderline  160-189  mg/dL   High  >190     mg/dL   Very High   CREATININE 0.58* 01/05/2016       Scheduled Meds: . atorvastatin  80 mg Oral q1800  . cefTRIAXone (ROCEPHIN)  IV  2 g Intravenous Q12H  . docusate sodium  100 mg Oral BID  . feeding supplement (ENSURE ENLIVE)  237 mL Oral BID BM  . folic acid  1 mg Oral Daily  . levETIRAcetam  500 mg Oral BID  . metronidazole  500 mg Intravenous Q6H  . multivitamin with minerals  1 tablet Oral Daily  . pantoprazole  40 mg Oral Daily  . thiamine  100 mg Oral Daily  . vancomycin  1,250 mg Intravenous Q8H   Continuous Infusions:     LOS: 6 days    Time spent: [facility] Pager [phone]. If 7PM-7AM, please contact night-coverage at [email or web address], password [facility] 01/05/2016, 1:42 PM  LOS: 6 days

## 2016-01-05 NOTE — Progress Notes (Addendum)
Pharmacy Antibiotic Note  Jeffery Prince is a 50 y.o. male here with syncope.  He is noted with a L frontal brain abscess. Pharmacy has been consulted for vancomycin dosing. He is also noted on ceftriaxone. ID saw and recommends ceftriaxone 2gm IV q12hr and metronidazole  Day #6 of abx for L frontal brain abscess. ID saw and recommends continuing broad spectrum abx for 6 weeks ending June 30. VT this AM was 20.   Plan: Continue vancomycin 1,250mg  IV q8h Continue ceftriaxone to 2gm IV q12h Continue Flagyl 500mg  IV Q6h Monitor clinical picture, renal function, VT prn F/U C&S, abx deescalation / LOT  Height: 5\' 8"  (172.7 cm) Weight: 160 lb 11.5 oz (72.9 kg) IBW/kg (Calculated) : 68.4  Temp (24hrs), Avg:98.5 F (36.9 C), Min:98.2 F (36.8 C), Max:98.9 F (37.2 C)   Recent Labs Lab 12/31/15 0433 01/01/16 0621 01/02/16 0326 01/02/16 1328 01/03/16 0401 01/05/16 0633 01/05/16 0634  WBC 11.2* 17.1* 14.5*  --  13.7*  --  10.6*  CREATININE 0.75 0.64 0.63  --  0.57*  --  0.58*  VANCOTROUGH  --   --   --  8*  --  20  --     Estimated Creatinine Clearance: 106.9 mL/min (by C-G formula based on Cr of 0.58).    No Known Allergies  Antimicrobials this admission: 5/16 Ceftriaxone >> 5/16 Vancomycin >>    5/18 VT = 8 (Drawn slightly late and morning dose given early; will still increase rate slightly)    5/21 VT = 20 5/17 Flagyl >>  Dose adjustments this admission: 5/18 Inc vanc from 1g q8h to 1250mg  q8h  Microbiology results: 5/16 blood x2: ngtd 5/16 wound: ngtd 5/16 abscess: ngtd 5/15 MRSA PCR- neg  Thank you for allowing pharmacy to be a part of this patient's care.  Angela Burke, PharmD Pharmacy Resident Pager: [phone]  01/05/2016 7:34 AM

## 2016-01-05 NOTE — Progress Notes (Signed)
Patient ID: Jeffery Prince, male   DOB: 10/01/1965, 50 y.o.   MRN: [id number] Neuro unchanged. Oriented x 2.

## 2016-01-06 NOTE — Progress Notes (Signed)
Patient ID: Jeffery Prince, male   DOB: 07/11/1966, 50 y.o.   MRN: [id number] Subjective:  The patient is alert and pleasant. He is in no apparent distress. He has no complaints.  Objective: Vital signs in last 24 hours: Temp:  [98.4 F (36.9 C)-99.8 F (37.7 C)] 98.4 F (36.9 C) (05/22 1417) Pulse Rate:  [58-74] 68 (05/22 1417) Resp:  [16-18] 18 (05/22 1417) BP: (111-129)/(69-80) 129/80 mmHg (05/22 1417) SpO2:  [94 %-99 %] 94 % (05/22 1417)  Intake/Output from previous day: 05/21 0701 - 05/22 0700 In: 700 [P.O.:700] Out: 500 [Urine:500] Intake/Output this shift: Total I/O In: 490 [P.O.:480; I.V.:10] Out: -   Physical exam the patient is alert and oriented. He has a mild expressive dysphasia. His wound is healing well. I removed the dressing. His strength is normal.  Lab Results:  Recent Labs  01/05/16 0634  WBC 10.6*  HGB 13.7  HCT 41.1  PLT 385   BMET  Recent Labs  01/05/16 0634  NA 139  K 3.9  CL 103  CO2 25  GLUCOSE 99  BUN 6  CREATININE 0.58*  CALCIUM 9.2    Studies/Results: No results found.  Assessment/Plan: Postop day #7: The patient is ready for transfer to a skilled nursing facility for long-term antibiotics. We are awaiting placement.  LOS: 7 days     Jeffery Prince D 01/06/2016, 4:33 PM

## 2016-01-06 NOTE — Progress Notes (Addendum)
CSW left message for Financial Counseling regarding Medicaid Application.  Patient has a bed at [facility]. Paperwork has been completed and faxed over to them for admission.  Percell Locus Roman Dubuc LCSWA [phone]

## 2016-01-06 NOTE — Care Management Note (Signed)
Case Management Note  Patient Details  Name: Jeffery Prince MRN: [id number] Date of Birth: [birth year]  Subjective/Objective:                    Action/Plan: Pt will need several weeks of IV antibiotics at discharge. Pt lives with sister but she is unable to provide 24 hour care. Plan is for SNF for IV antibiotic therapy. CM continuing to follow for d/c needs.   Expected Discharge Date:                  Expected Discharge Plan:  [place]  In-House Referral:     Discharge planning Services  CM Consult  Post Acute Care Choice:    Choice offered to:     DME Arranged:    DME Agency:     HH Arranged:    [place] Agency:     Status of Service:  In process, will continue to follow  Medicare Important Message Given:    Date Medicare IM Given:    Medicare IM give by:    Date Additional Medicare IM Given:    Additional Medicare Important Message give by:     If discussed at [place] of Stay Meetings, dates discussed:    Additional Comments:  Pollie Friar, RN 01/06/2016, 3:03 PM

## 2016-01-06 NOTE — Progress Notes (Signed)
Occupational Therapy Treatment Patient Details Name: Jeffery Prince MRN: [id number] DOB: Dec 30, 1965 Today's Date: 01/06/2016    History of present illness This 50 y.o. male transferred from [place] after LOC.  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  He underwetn Lt frontal craniotomy 12/31/15.   PMH includes:  ischemic cardiomyopathy, MI, CAD, HTN, tobacco abuse.   OT comments  Pt demonstrates R inattention lack of awareness and deficits that will greatly affect patients safety with return to work. 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 Pt currently is unable to locate objects on a paper worksheet without errors. Recommend SNF at this time due to sister unable to provide 39 / 7 (A)  Sister works third shift 8 pm to 6 am and tries to visit around 3pm in the afternoons.    Follow Up Recommendations  SNF    Equipment Recommendations  None recommended by OT    Recommendations for Other Services      Precautions / Restrictions Precautions Precautions: Fall       Mobility Bed Mobility Overal bed mobility: Independent                Transfers                      Balance     Sitting balance-Leahy Scale: Normal                             ADL                                         General ADL Comments: Pt works as a Acupuncturist. Pt must use claw hand on machine to pick up the log in the wooded area and drive it to the area for loading it onto the truck. the patient does not load the trucks but rather gets the log from the woods. Pt demonstrates R inattention with lack of awareness that would affect his job duties      Vision                 Additional Comments: visual scanning asessment. pt presented with 14 circles and asked to number them 1-14 putting one number per circle. Pt worked top to bottom L to Right in a scattered pattern demonstrating decr organization to  visual scanning. Pt presented with a circle and asked to draw numbers hands and set clock 10 past 11. pt drew 11 and 10 on the clock . pt asked to mark A- G left to right . pt just drawing lines L to R connecting letters.    Perception     Praxis      Cognition   Behavior During Therapy: Flat affect Overall Cognitive Status: Impaired/Different from baseline Area of Impairment: Attention;Problem solving        Following Commands: Follows multi-step commands with increased time     Problem Solving: Slow processing General Comments: Pt demonstrates decr R attention with visual assessment too.    Extremity/Trunk Assessment               Exercises     Shoulder Instructions       General Comments      Pertinent Vitals/ Pain       Pain Assessment: No/denies pain  Home Living                                          Prior Functioning/Environment              Frequency Min 2X/week     Progress Toward Goals  OT Goals(current goals can now be found in the care plan section)  Progress towards OT goals: Progressing toward goals  Acute Rehab OT Goals Patient Stated Goal: did not state OT Goal Formulation: With patient Time For Goal Achievement: 01/16/16 Potential to Achieve Goals: Good ADL Goals Pt Will Perform Grooming: with modified independence;standing Pt Will Perform Upper Body Bathing: with modified independence Pt Will Perform Lower Body Bathing: with modified independence;sit to/from stand Pt Will Perform Upper Body Dressing: with modified independence;sitting;standing Pt Will Perform Lower Body Dressing: with modified independence;sit to/from stand Pt Will Transfer to Toilet: with modified independence;ambulating;regular height toilet Pt Will Perform Toileting - Clothing Manipulation and hygiene: with modified independence;sit to/from stand Additional ADL Goal #1: Pt will locate items on Rt consistently with min verbal cues   Additional ADL Goal #2: Pt will be able to sequence through familiar ADL tasks with no cues   Plan Discharge plan needs to be updated    Co-evaluation                 End of Session     Activity Tolerance Patient tolerated treatment well   Patient Left in bed;with call bell/phone within reach   Nurse Communication Mobility status        Time: [id number] OT Time Calculation (min): 19 min  Charges: OT General Charges $OT Visit: 1 Procedure OT Treatments $Therapeutic Activity: 8-22 mins  Parke Poisson B 01/06/2016, 3:47 PM  Jeri Modena   OTR/L Pager: [phone] Office: [phone] .

## 2016-01-06 NOTE — Clinical Social Work Note (Signed)
Clinical Social Work Assessment  Patient Details  Name: Jeffery Prince MRN: [id number] Date of Birth: [birth year]  Date of referral:  01/06/16               Reason for consult:  Facility Placement                Permission sought to share information with:  Facility Sport and exercise psychologist, Family Supports Permission granted to share information::  Yes, Verbal Permission Granted  Name::     Mickel Baas  Agency::  [facility]  Relationship::  Sister  Contact Information:  [phone]  Housing/Transportation Living arrangements for the past 2 months:  Single Family Home Source of Information:  Patient, Other (Comment Required) (Sister) Patient Interpreter Needed:  None Criminal Activity/Legal Involvement Pertinent to Current Situation/Hospitalization:  No - Comment as needed Significant Relationships:  Siblings Lives with:  Self Do you feel safe going back to the place where you live?  No Need for family participation in patient care:  Yes (Comment)  Care giving concerns:  CSW received referral for possible SNF placement at time of discharge. CSW met with patient and patient's sister at bedside regarding PT recommendation of SNF placement at time of discharge. Per patient's sister, patient is currently unable to care for himself at home given patient's current medical needs. Patient and patient's sister expressed understanding of PT recommendation and are agreeable to SNF placement at time of discharge. CSW to continue to follow and assist with discharge planning needs.   Social Worker assessment / plan:  CSW spoke with patient and patient's sister concerning possibility of rehab at [facility] before returning home.  Employment status:  Temporary Insurance information:  Self Pay (Medicaid Pending) PT Recommendations:  [place] / Referral to community resources:  [place]  Patient/Family's Response to care:  Patient and patient's sister  recognize need for rehab before returning home and are agreeable to a SNF in [facility]. Sister completed paperwork with patient at bedside for [facility] and Rehab.  Patient/Family's Understanding of and Emotional Response to Diagnosis, Current Treatment, and Prognosis:  Patient is realistic regarding therapy needs. No questions/concerns about plan or treatment.    Emotional Assessment Appearance:  Appears stated age Attitude/Demeanor/Rapport:   (Appropriate) Affect (typically observed):  Accepting, Appropriate Orientation:  Oriented to  Time, Oriented to Place, Oriented to Self Alcohol / Substance use:  Alcohol Use Psych involvement (Current and /or in the community):  No (Comment)  Discharge Needs  Concerns to be addressed:  Care Coordination Readmission within the last 30 days:  No Current discharge risk:  None Barriers to Discharge:  No Barriers Identified   Benard Halsted, [place] 01/06/2016, 5:01 PM

## 2016-01-06 NOTE — NC FL2 (Signed)
[place] LEVEL OF CARE SCREENING TOOL     IDENTIFICATION  Patient Name: Jeffery Prince Birthdate: Oct 09, 1965 Sex: male Admission Date (Current Location): 12/30/2015  [facility] and [place] Number:  Herbalist and Address:  [facility], [place] [address], [place], [place] [address]      Provider Number: [id number]  Attending Physician Name and Address:  Newman Pies, MD  Relative Name and Phone Number:  Mickel Baas sister, [phone]    Current Level of Care: Hospital Recommended Level of Care: [place] Prior Approval Number:    Date Approved/Denied:   PASRR Number: [id number]  Discharge Plan: SNF    Current Diagnoses: Patient Active Problem List   Diagnosis Date Noted  . 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 Brain tumor ([place])   . Brain mass 12/30/2015  . HLD (hyperlipidemia)   . HTN (hypertension)   . Tobacco abuse   . HYPERLIPIDEMIA 08/06/2010  . Alcohol abuse 08/06/2010  . TOBACCO USER 08/06/2010  . HYPERTENSION, BENIGN 08/06/2010  . 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 CORONARY ATHEROSCLEROSIS NATIVE CORONARY ARTERY 08/06/2010  . SKIN RASH 08/06/2010  . CAD (coronary artery disease) 07/17/2010    Orientation RESPIRATION BLADDER Height & Weight     Self, Place, Time  Normal Continent Weight: 72.9 kg (160 lb 11.5 oz) Height:  5\' 8"  (172.7 cm)  BEHAVIORAL SYMPTOMS/MOOD NEUROLOGICAL BOWEL NUTRITION STATUS      Continent Diet (Please see DC summary)  AMBULATORY STATUS COMMUNICATION OF NEEDS Skin   Independent Verbally Surgical wounds (Closed incision on head)                       Personal Care Assistance Level of Assistance  Bathing, Feeding, Dressing Bathing Assistance: Limited assistance Feeding assistance: Independent Dressing Assistance: Independent     Functional Limitations Info             SPECIAL CARE FACTORS FREQUENCY  PT (By licensed PT)     PT Frequency:  min 3x/week              Contractures      Additional Factors Info  Code Status, Allergies Code Status Info: Full Allergies Info: NKA           Current Medications (01/06/2016):  This is the current hospital active medication list Current Facility-Administered Medications  Medication Dose Route Frequency Provider Last Rate Last Dose  . acetaminophen (TYLENOL) tablet 650 mg  650 mg Oral Q4H PRN Newman Pies, MD   650 mg at 01/04/16 0202   Or  . acetaminophen (TYLENOL) suppository 650 mg  650 mg Rectal Q4H PRN Newman Pies, MD      . atorvastatin (LIPITOR) tablet 80 mg  80 mg Oral q1800 Etta Quill, DO   80 mg at 01/05/16 1750  . bisacodyl (DULCOLAX) suppository 10 mg  10 mg Rectal Daily PRN Newman Pies, MD      . cefTRIAXone (ROCEPHIN) 2 g in dextrose 5 % 50 mL IVPB  2 g Intravenous Q12H Kris Mouton, RPH   2 g at 01/06/16 0935  . docusate sodium (COLACE) capsule 100 mg  100 mg Oral BID Newman Pies, MD   100 mg at 01/06/16 0934  . feeding supplement (ENSURE ENLIVE) (ENSURE ENLIVE) liquid 237 mL  237 mL Oral BID BM Newman Pies, MD   237 mL at 01/06/16 0946  . folic acid (FOLVITE) tablet 1 mg  1 mg Oral Daily Etta Quill, DO   1 mg at 01/06/16 [id number]  . HYDROcodone-acetaminophen (NORCO/VICODIN) 5-325 MG per tablet 1 tablet  1 tablet Oral Q4H PRN Newman Pies, MD   1 tablet at 01/06/16 0435  . labetalol (NORMODYNE,TRANDATE) injection 10-40 mg  10-40 mg Intravenous Q10 min PRN Newman Pies, MD   10 mg at 12/31/15 1853  . levETIRAcetam (KEPPRA) tablet 500 mg  500 mg Oral BID Reyne Dumas, MD   500 mg at 01/06/16 0933  . metroNIDAZOLE (FLAGYL) IVPB 500 mg  500 mg Intravenous Q6H Cecilio Asper Batchelder, RPH   500 mg at 01/06/16 0816  . morphine 2 MG/ML injection 1-2 mg  1-2 mg Intravenous Q2H PRN Newman Pies, MD   2 mg at 01/04/16 1719  . multivitamin with minerals tablet 1 tablet  1 tablet Oral Daily Etta Quill, DO   1 tablet at 01/06/16 [id number]  .  ondansetron (ZOFRAN) tablet 4 mg  4 mg Oral Q4H PRN Newman Pies, MD       Or  . ondansetron [facility]) injection 4 mg  4 mg Intravenous Q4H PRN Newman Pies, MD      . pantoprazole (PROTONIX) EC tablet 40 mg  40 mg Oral Daily Reyne Dumas, MD   40 mg at 01/06/16 0933  . promethazine (PHENERGAN) tablet 12.5-25 mg  12.5-25 mg Oral Q4H PRN Newman Pies, MD      . sodium chloride flush (NS) 0.9 % injection 10-40 mL  10-40 mL Intracatheter PRN Kristeen Miss, MD   10 mL at 01/06/16 0829  . thiamine (VITAMIN B-1) tablet 100 mg  100 mg Oral Daily Etta Quill, DO   100 mg at 01/06/16 0940  . vancomycin (VANCOCIN) 1,250 mg in sodium chloride 0.9 % 250 mL IVPB  1,250 mg Intravenous Q8H Cecilio Asper Batchelder, RPH   1,250 mg at 01/06/16 [id number]     Discharge Medications: Please see discharge summary for a list of discharge medications.  Relevant Imaging Results:  Relevant Lab Results:   Additional Information SSN: [id number]   Needs IV ABX.  Benard Halsted, LCSWA

## 2016-01-06 NOTE — Progress Notes (Signed)
Physical Therapy Treatment Patient Details Name: Osamah Kesten MRN: [id number] DOB: 1966-07-15 Today's Date: 01/06/2016    History of Present Illness This 50 y.o. male transferred from [place] after LOC.  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  He underwetn Lt frontal craniotomy 12/31/15.   PMH includes:  ischemic cardiomyopathy, MI, CAD, HTN, tobacco abuse.    PT Comments    Patient progressing well towards mobility goals. Continues to have difficulty problem solving how to find his room. Requires max visual and verbal directional cues. Difficulty with reading signs and interpreting meaning to find locations.Tolerated stair training without difficulty. Concerned about pt being home without 24/7 S due to safety concerns. Mild right neglect noted but better able to use RUE for functional tasks today. Will follow.   Follow Up Recommendations  Outpatient PT;Supervision/Assistance - 24 hour (needs 24/7 due to cognitive/safety issues)     Equipment Recommendations  None recommended by PT    Recommendations for Other Services       Precautions / Restrictions Precautions Precautions: Fall Precaution Comments: due to safety issues Restrictions Weight Bearing Restrictions: No    Mobility  Bed Mobility Overal bed mobility: Independent Bed Mobility: Supine to Sit;Sit to Supine     Supine to sit: Independent Sit to supine: Independent   General bed mobility comments: No use of rails.  Transfers Overall transfer level: Modified independent Equipment used: None Transfers: Sit to/from Stand           General transfer comment: Stood from EOB without difficulty. Slow to stand.   Ambulation/Gait Ambulation/Gait assistance: Modified independent (Device/Increase time) Ambulation Distance (Feet): 400 Feet Assistive device: None Gait Pattern/deviations: Drifts right/left Gait velocity: decreased Gait velocity interpretation: Below normal speed  for age/gender General Gait Details: Mod I only due to slower gait speed, may be baseline. Tolerated higher level balance challenges with only mild deviations in gait but  no LOB.   Stairs Stairs: Yes Stairs assistance: Modified independent (Device/Increase time) Stair Management: One rail Right;Alternating pattern Number of Stairs: 4 General stair comments: Cues for safety/technique.  Wheelchair Mobility    Modified Rankin (Stroke Patients Only)       Balance   Sitting-balance support: Feet supported;No upper extremity supported Sitting balance-Leahy Scale: Normal       Standing balance-Leahy Scale: Good               High level balance activites: Backward walking;Direction changes;Turns;Sudden stops;Head turns High Level Balance Comments: Tolerated above with only mild deviations in gait but no LOB.     Cognition Arousal/Alertness: Awake/alert Behavior During Therapy: Flat affect Overall Cognitive Status: Impaired/Different from baseline       Memory: Decreased short-term memory Following Commands: Follows multi-step commands inconsistently     Problem Solving: Slow processing;Requires verbal cues General Comments: Stood outside room for a few mins trying to find room. Despite,  max visual and verbal cues, not able to find room.    Exercises      General Comments General comments (skin integrity, edema, etc.): Better able to use BUEs for tasks - donning socks using BUEs.      Pertinent Vitals/Pain Pain Assessment: Faces Pain Score: 3  Pain Location: head Pain Descriptors / Indicators: Aching Pain Intervention(s): Monitored during session    Home Living                      Prior Function  PT Goals (current goals can now be found in the care plan section) Progress towards PT goals: Progressing toward goals    Frequency  Min 3X/week    PT Plan Current plan remains appropriate    Co-evaluation             End of  Session Equipment Utilized During Treatment: Gait belt Activity Tolerance: Patient tolerated treatment well Patient left: in bed;with call bell/phone within reach;with bed alarm set;with nursing/sitter in room     Time: [id number] PT Time Calculation (min) (ACUTE ONLY): 20 min  Charges:  $Gait Training: 8-22 mins                    G Codes:      Tranice Laduke A Jahnavi Muratore 01/06/2016, 10:15 AM Wray Kearns, [place], DPT [phone]

## 2016-01-07 MED ORDER — VANCOMYCIN HCL 10 G IV SOLR: 1250.0000 mg | Freq: Three times a day (TID) | INTRAVENOUS | Status: AC

## 2016-01-07 MED ORDER — DOCUSATE SODIUM 100 MG PO CAPS: 100.0000 mg | ORAL_CAPSULE | Freq: Two times a day (BID) | ORAL | Status: AC

## 2016-01-07 MED ORDER — THIAMINE HCL 100 MG PO TABS: 100.0000 mg | ORAL_TABLET | Freq: Every day | ORAL | Status: AC

## 2016-01-07 MED ORDER — ENSURE ENLIVE PO LIQD: 237.0000 mL | Freq: Two times a day (BID) | ORAL | Status: AC

## 2016-01-07 MED ORDER — LEVETIRACETAM 500 MG PO TABS: 500.0000 mg | ORAL_TABLET | Freq: Two times a day (BID) | ORAL | Status: AC

## 2016-01-07 MED ORDER — ATORVASTATIN CALCIUM 80 MG PO TABS: 80.0000 mg | ORAL_TABLET | Freq: Every day | ORAL | Status: AC

## 2016-01-07 MED ORDER — BISACODYL 10 MG RE SUPP: 10.0000 mg | Freq: Every day | RECTAL | Status: AC | PRN

## 2016-01-07 MED ORDER — PANTOPRAZOLE SODIUM 40 MG PO TBEC: 40.0000 mg | DELAYED_RELEASE_TABLET | Freq: Every day | ORAL | Status: AC

## 2016-01-07 MED ORDER — ADULT MULTIVITAMIN W/MINERALS CH: 1.0000 | ORAL_TABLET | Freq: Every day | ORAL | Status: AC

## 2016-01-07 MED ORDER — DEXTROSE 5 % IV SOLN: 2.0000 g | Freq: Two times a day (BID) | INTRAVENOUS | Status: AC

## 2016-01-07 MED ORDER — HYDROCODONE-ACETAMINOPHEN 5-325 MG PO TABS: 1.0000 | ORAL_TABLET | ORAL | Status: DC | PRN

## 2016-01-07 MED ORDER — FOLIC ACID 1 MG PO TABS: 1.0000 mg | ORAL_TABLET | Freq: Every day | ORAL | Status: AC

## 2016-01-07 MED ORDER — HYDROCODONE-ACETAMINOPHEN 5-325 MG PO TABS: 1.0000 | ORAL_TABLET | ORAL | Status: AC | PRN

## 2016-01-07 MED ORDER — METRONIDAZOLE IN NACL 5-0.79 MG/ML-% IV SOLN: 500.0000 mg | Freq: Four times a day (QID) | INTRAVENOUS | Status: AC

## 2016-01-07 NOTE — Discharge Summary (Signed)
Physician Discharge Summary  Rishik Aldinger [id number] DOB: 1965-09-09 DOA: 12/30/2015  PCP: No primary care provider on file.  Admit date: 12/30/2015 Discharge date: 01/07/2016   Recommendations for Outpatient Follow-Up:   Aim for Vancomycin trough 15-20 (unless otherwise indicated) End date: July 3rd Weekly vanc trough, CBC with diff, and BMP- Fax weekly labs to [phone] Clinic Follow Up Appt: 6-7 wk @ [facility]  Discharge Diagnosis:   Active Problems:   Alcohol abuse   TOBACCO USER   CORONARY ATHEROSCLEROSIS NATIVE CORONARY ARTERY   HTN (hypertension)   Brain abscess   Discharge disposition:  SNF: with PICC for IV abx through 7/3  Discharge Condition: Improved.  Diet recommendation: Regular.  Wound care: None.   History of Present Illness:   Jeffery Prince is a 50 y.o. male with medical history significant of CAD s/p stent in 2012, has been off of his meds due to inability to afford for past 5 months. Patient presented to [facility] after an episode of apparent syncope that lasted 10 mins. There was also apparent confusion as well.  He was admitted to [facility], serial cardiac enzymes were negative, cardiac stress test was negative. CT scan was performed in the ED at the time of admission and demonstrated apparent L frontal lobe mass. MRI was performed which suggested a ring enhancing abscess vs mass. He was transferred to [facility] for neurosurgical consult.   Hospital Course by Problem:   Brain abscess-with edema : status post craniotomy - vancomycin, ceftriaxone, Flagyl for brain abscess -IV abx until 7/3 -Keppra for seizure prophylaxis, syncope possibly related to a seizure.  -HIV antibody nonreactive -2-D echo to rule out embolic source for the brain abscess negative,  - Infectious disease recommended to rule out dental cavity/abscess, no Panorex available therefore ordered a CT maxillofacialwhich did not show any dental abscess.   -expressive aphasia which is improving.  H/o EtOH abuse -continue thiamine and folic acid  H/o CAD - Continue statin, hold off on aspirin   HTN - not currently taking home meds, will hold off on starting any new meds at the moment      Medical Consultants:    NS  ID   Discharge Exam:   Filed Vitals:   01/07/16 0220 01/07/16 0540  BP: 114/73 130/72  Pulse: 60 61  Temp: 98.5 F (36.9 C) 98.6 F (37 C)  Resp: 18 16   Filed Vitals:   01/06/16 1849 01/06/16 2246 01/07/16 0220 01/07/16 0540  BP: 127/82 117/77 114/73 130/72  Pulse: 70 64 60 61  Temp: 100 F (37.8 C) 98.9 F (37.2 C) 98.5 F (36.9 C) 98.6 F (37 C)  TempSrc: Oral Oral Oral Oral  Resp: 18 18 18 16   Height:      Weight:      SpO2: 97% 96% 97% 98%    Gen:  NAD    The results of significant diagnostics from this hospitalization (including imaging, microbiology, ancillary and laboratory) are listed below for reference.     Procedures and Diagnostic Studies:   Mr Kizzie Fantasia Contrast  12/31/2015  CLINICAL DATA:  Syncopal episode today. Follow-up brain tumor versus abscess. EXAM: MRI HEAD WITHOUT AND WITH CONTRAST TECHNIQUE: Multiplanar, multiecho pulse sequences of the brain and surrounding structures were obtained without and with intravenous contrast. CONTRAST:  73mL MULTIHANCE GADOBENATE DIMEGLUMINE 529 MG/ML IV SOLN COMPARISON:  MRI of the head May 01, 2016 FINDINGS: INTRACRANIAL CONTENTS: Stable 27 x 23 x 34 mm LEFT frontal lobe  cystic mass with extremely bright reduced diffusion, low ADC value and marked surrounding FLAIR T2 hyperintense vasogenic edema. Irregular marginal enhancement with multiple contiguous satellite foci of cystic reduced diffusion and peripheral enhancement extending to the dura with focal dural thickening and enhancement. Local sulcal effacement and mass effect without midline shift. Patchy supratentorial white matter FLAIR T2  hyperintensities. Ventricles and sulci are overall normal for patient's age. ORBITS: The included ocular globes and orbital contents are non-suspicious. SINUSES: The mastoid air-cells and included paranasal sinuses are well-aerated. SKULL/SOFT TISSUES: No abnormal sellar expansion. No suspicious calvarial bone marrow signal. Craniocervical junction maintained. 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 Findings are unlikely to represent metastatic disease. Mild chronic small vessel ischemic disease. Electronically Signed   By: Elon Alas M.D.   On: 12/31/2015 02:56   Dg Chest Port 1 View  12/31/2015  CLINICAL DATA:  Status post central line placement EXAM: PORTABLE CHEST 1 VIEW COMPARISON:  12/28/2015 FINDINGS: Cardiac shadow is stable. A new right jugular central line is noted in the proximal superior vena cava. No pneumothorax is noted. No focal infiltrate or sizable effusion is seen. No bony abnormality is noted. IMPRESSION: No pneumothorax following central line placement. Electronically Signed   By: Inez Catalina M.D.   On: 12/31/2015 17:20     Labs:   Basic Metabolic Panel:  Recent Labs Lab 01/01/16 0621 01/02/16 0326 01/03/16 0401 01/05/16 0634  NA 136 140 141 139  K 4.5 4.1 3.8 3.9  CL 103 104 106 103  CO2 21* 26 24 25   GLUCOSE 110* 101* 97 99  BUN 6 <5* <5* 6  CREATININE 0.64 0.63 0.57* 0.58*  CALCIUM 8.7* 8.9 8.7* 9.2   GFR Estimated Creatinine Clearance: 106.9 mL/min (by C-G formula based on Cr of 0.58). Liver Function Tests:  Recent Labs Lab 01/01/16 0621 01/02/16 0326 01/03/16 0401  AST 23 13* 13*  ALT 13* 13* 16*  ALKPHOS 92 82 86  BILITOT 2.0* 0.7 0.5  PROT 6.9 6.9 6.5  ALBUMIN 3.3* 2.9* 2.7*   No results for input(s): LIPASE, AMYLASE in the last 168 hours. No results for input(s): AMMONIA in the last 168 hours. Coagulation profile No results for input(s): INR, PROTIME in the last  168 hours.  CBC:  Recent Labs Lab 01/01/16 0621 01/02/16 0326 01/03/16 0401 01/05/16 0634  WBC 17.1* 14.5* 13.7* 10.6*  HGB 14.4 13.4 13.3 13.7  HCT 41.9 41.0 40.2 41.1  MCV 94.2 95.3 94.4 93.4  PLT 319 311 311 385   Cardiac Enzymes: No results for input(s): CKTOTAL, CKMB, CKMBINDEX, TROPONINI in the last 168 hours. BNP: Invalid input(s): POCBNP CBG:  Recent Labs Lab 01/05/16 1115  GLUCAP 109*   D-Dimer No results for input(s): DDIMER in the last 72 hours. Hgb A1c No results for input(s): HGBA1C in the last 72 hours. Lipid Profile No results for input(s): CHOL, HDL, LDLCALC, TRIG, CHOLHDL, LDLDIRECT in the last 72 hours. Thyroid function studies No results for input(s): TSH, T4TOTAL, T3FREE, THYROIDAB in the last 72 hours.  Invalid input(s): FREET3 Anemia work up No results for input(s): VITAMINB12, FOLATE, FERRITIN, TIBC, IRON, RETICCTPCT in the last 72 hours. Microbiology Recent Results (from the past 240 hour(s))  MRSA PCR Screening     Status: None   Collection Time: 12/30/15  5:51 PM  Result Value Ref Range Status   MRSA by PCR NEGATIVE NEGATIVE Final    Comment:  The GeneXpert MRSA Assay (FDA approved for NASAL specimens only), is one component of a comprehensive MRSA colonization surveillance program. It is not intended to diagnose MRSA infection nor to guide or monitor treatment for MRSA infections.   Anaerobic culture     Status: None   Collection Time: 12/31/15  3:52 PM  Result Value Ref Range Status   Specimen Description ABSCESS  Final   Special Requests DRAINAGE FROM BRAIN  Final   Gram Stain   Final    FEW WBC PRESENT, PREDOMINANTLY PMN NO SQUAMOUS EPITHELIAL CELLS SEEN NO ORGANISMS SEEN Performed at [facility] Performed at [facility]    Culture   Final    NO ANAEROBES ISOLATED Performed at [facility]    Report Status 01/05/2016 FINAL  Final  Gram stain     Status: None   Collection Time:  12/31/15  3:52 PM  Result Value Ref Range Status   Specimen Description ABSCESS  Final   Special Requests DRAINAGE FROM BRAIN  Final   Gram Stain   Final    FEW WBC PRESENT, PREDOMINANTLY PMN NO ORGANISMS SEEN Gram Stain Report Called to,Read Back By and Verified With:    Report Status 12/31/2015 FINAL  Final  Culture, routine-abscess     Status: None   Collection Time: 12/31/15  3:52 PM  Result Value Ref Range Status   Specimen Description ABSCESS  Final   Special Requests DRAINAGE FROM BRAIN  Final   Gram Stain   Final    FEW WBC PRESENT, PREDOMINANTLY PMN NO SQUAMOUS EPITHELIAL CELLS SEEN NO ORGANISMS SEEN Performed at [facility] Performed at [facility]    Culture   Final    NO GROWTH 2 DAYS Performed at [facility]    Report Status 01/03/2016 FINAL  Final  Culture, blood (routine x 2)     Status: None   Collection Time: 12/31/15  8:04 PM  Result Value Ref Range Status   Specimen Description BLOOD CENTRAL LINE  Final   Special Requests BOTTLES DRAWN AEROBIC AND ANAEROBIC 5CC  Final   Culture NO GROWTH 5 DAYS  Final   Report Status 01/05/2016 FINAL  Final  Culture, blood (routine x 2)     Status: None   Collection Time: 12/31/15  8:10 PM  Result Value Ref Range Status   Specimen Description BLOOD LEFT ARM  Final   Special Requests BOTTLES DRAWN AEROBIC AND ANAEROBIC 5CC  Final   Culture NO GROWTH 5 DAYS  Final   Report Status 01/05/2016 FINAL  Final  MRSA PCR Screening     Status: None   Collection Time: 01/01/16 11:44 AM  Result Value Ref Range Status   MRSA by PCR NEGATIVE NEGATIVE Final    Comment:        The GeneXpert MRSA Assay (FDA approved for NASAL specimens only), is one component of a comprehensive MRSA colonization surveillance program. It is not intended to diagnose MRSA infection nor to guide or monitor treatment for MRSA infections.      Discharge Instructions:       Discharge Instructions    Diet general     Complete by:  As directed      Discharge instructions    Complete by:  As directed   IV abx through July 3rd with weekly vanc trough, CBC, BMP Can follow up in ID clinic     Increase activity slowly    Complete by:  As directed  Medication List    STOP taking these medications        BC HEADACHE POWDER PO     lisinopril 20 MG tablet  Commonly known as:  PRINIVIL,ZESTRIL     metoprolol tartrate 25 MG tablet  Commonly known as:  LOPRESSOR     pravastatin 40 MG tablet  Commonly known as:  PRAVACHOL      TAKE these medications        acetaminophen 500 MG tablet  Commonly known as:  TYLENOL  Take 1,000 mg by mouth every 6 (six) hours as needed (pain).     atorvastatin 80 MG tablet  Commonly known as:  LIPITOR  Take 1 tablet (80 mg total) by mouth daily at 6 PM.     bisacodyl 10 MG suppository  Commonly known as:  DULCOLAX  Place 1 suppository (10 mg total) rectally daily as needed for moderate constipation.     cefTRIAXone 2 g in dextrose 5 % 50 mL  Inject 2 g into the vein every 12 (twelve) hours.     docusate sodium 100 MG capsule  Commonly known as:  COLACE  Take 1 capsule (100 mg total) by mouth 2 (two) times daily.     feeding supplement (ENSURE ENLIVE) Liqd  Take 237 mLs by mouth 2 (two) times daily between meals.     folic acid 1 MG tablet  Commonly known as:  FOLVITE  Take 1 tablet (1 mg total) by mouth daily.     HYDROcodone-acetaminophen 5-325 MG tablet  Commonly known as:  NORCO/VICODIN  Take 1 tablet by mouth every 4 (four) hours as needed for moderate pain.     levETIRAcetam 500 MG tablet  Commonly known as:  KEPPRA  Take 1 tablet (500 mg total) by mouth 2 (two) times daily.     metroNIDAZOLE 5-0.79 MG/ML-% IVPB  Commonly known as:  FLAGYL  Inject 100 mLs (500 mg total) into the vein every 6 (six) hours.     multivitamin with minerals Tabs tablet  Take 1 tablet by mouth daily.     pantoprazole 40 MG tablet  Commonly known as:   PROTONIX  Take 1 tablet (40 mg total) by mouth daily.     thiamine 100 MG tablet  Take 1 tablet (100 mg total) by mouth daily.     vancomycin 1,250 mg in sodium chloride 0.9 % 250 mL  Inject 1,250 mg into the vein every 8 (eight) hours.       Follow-up Information    Please follow up.   Why:  PCP as outpatient       Time coordinating discharge: 35 min  Signed:  Sophia Cubero U Kahlia Lagunes   [facility] 01/07/2016, 10:19 AM

## 2016-01-07 NOTE — Progress Notes (Signed)
Patient will discharge to [facility] Anticipated discharge date: 5/23 Family notified: pt to notify Transportation by PTAR- scheduled for 1:30pm  CSW signing off.  Domenica Reamer, [place] Social Worker [phone]

## 2016-01-07 NOTE — Progress Notes (Addendum)
[place] for Infectious Disease    Date of Admission:  12/30/2015   Total days of antibiotics 8        Day 8 ceftriaxone        Day 8 vanco        Day 8 metronidazole   ID: Jeffery Prince is a 50 y.o. male with  Brain abscess Active Problems:   Alcohol abuse   TOBACCO USER   CORONARY ATHEROSCLEROSIS NATIVE CORONARY ARTERY   HTN (hypertension)   Brain abscess    Subjective: Afebrile, still reports wound finding difficulties  24hr: all cultures negative  Medications:  . atorvastatin  80 mg Oral q1800  . cefTRIAXone (ROCEPHIN)  IV  2 g Intravenous Q12H  . docusate sodium  100 mg Oral BID  . feeding supplement (ENSURE ENLIVE)  237 mL Oral BID BM  . folic acid  1 mg Oral Daily  . levETIRAcetam  500 mg Oral BID  . metronidazole  500 mg Intravenous Q6H  . multivitamin with minerals  1 tablet Oral Daily  . pantoprazole  40 mg Oral Daily  . thiamine  100 mg Oral Daily  . vancomycin  1,250 mg Intravenous Q8H    Objective: Vital signs in last 24 hours: Temp:  [98.4 F (36.9 C)-100 F (37.8 C)] 98.6 F (37 C) (05/23 0540) Pulse Rate:  [58-70] 61 (05/23 0540) Resp:  [16-18] 16 (05/23 0540) BP: (111-130)/(69-82) 130/72 mmHg (05/23 0540) SpO2:  [94 %-98 %] 98 % (05/23 0540) Physical Exam  Constitutional: He is oriented to person, place, and time. He appears well-developed and well-nourished. No distress.  HENT:  Mouth/Throat: Oropharynx is clear and moist. No oropharyngeal exudate.  Cardiovascular: Normal rate, regular rhythm and normal heart sounds. Exam reveals no gallop and no friction rub.  No murmur heard.  Pulmonary/Chest: Effort normal and breath sounds normal. No respiratory distress. He has no wheezes.  Abdominal: Soft. Bowel sounds are normal. He exhibits no distension. There is no tenderness.  Lymphadenopathy:  He has no cervical adenopathy.  Neurological: He is alert and oriented to person, place, and time. +word finding difficulties Skin: Skin is warm  and dry. Scalp staples in place Psychiatric: He has a normal mood and affect. His behavior is normal.      Lab Results  Recent Labs  01/05/16 0634  WBC 10.6*  HGB 13.7  HCT 41.1  NA 139  K 3.9  CL 103  CO2 25  BUN 6  CREATININE 0.58*   Liver Panel No results for input(s): PROT, ALBUMIN, AST, ALT, ALKPHOS, BILITOT, BILIDIR, IBILI in the last 72 hours. No results found for: ESRSEDRATE, POCTSEDRATE  Microbiology: Brain tissue = no growth at 48hr Studies/Results: No results found.   Assessment/Plan: Brain abscess = plan for 8 wk IV therapy for brain abscess. He is currently day 8 of 56 days. continue on current regimen of vancomycin, ceftriaxone and metronidazole. Will need follow nchct at end of abtx therapy in order to determine if needs continued abtx  vanco goal trough is 15-20.   Poor Dentition = consider dental consult for dental extractions  Expressive aphasia = may benefit from working with Pt/OT  ------------------------------------------------------------------------------------------------------ Home health and SNF orders  Diagnosis: Brain abscess  Culture Result: no growth  No Known Allergies  Discharge antibiotics: Per pharmacy protocol  Vancomycin, plus ceftriaxone 2gm IV Q 12, plus metronidazole 552m IV Q 8rh Aim for Vancomycin trough 15-20 (unless otherwise indicated) Duration: 7 wk End Date: July 3rd  [facility] Care Per Protocol:  Labs weekly while on IV antibiotics: _x_ CBC with differential weekly _x_ BMP weekly __ CRP __ ESR _x_ Vancomycin trough weeky  Fax weekly labs to [phone]  Clinic Follow Up Appt: 6-7 wk  @ [place], [facility] for Infectious Diseases Cell: [phone] Pager: [phone]  01/07/2016, 9:52 AM

## 2016-01-07 NOTE — Care Management Note (Signed)
Case Management Note  Patient Details  Name: Ison Gaida MRN: [id number] Date of Birth: [birth year]  Subjective/Objective:                    Action/Plan: Plan is for patient to discharge to [place]. No further needs per CM.   Expected Discharge Date:                  Expected Discharge Plan:  Skilled Nursing Facility  In-House Referral:  Clinical Social Work  Discharge planning Services  CM Consult  Post Acute Care Choice:    Choice offered to:     DME Arranged:    DME Agency:     HH Arranged:    [place] Agency:     Status of Service:  Completed, signed off  Medicare Important Message Given:    Date Medicare IM Given:    Medicare IM give by:    Date Additional Medicare IM Given:    Additional Medicare Important Message give by:     If discussed at [place] of Stay Meetings, dates discussed:    Additional Comments:  Pollie Friar, RN 01/07/2016, 10:27 AM

## 2016-01-07 NOTE — Progress Notes (Signed)
Patient ID: Jeffery Prince, male   DOB: [birth year], 50 y.o.   MRN: [id number] Subjective:  The patient is alert and pleasant. He has had no apparent distress. He looks well.  Objective: Vital signs in last 24 hours: Temp:  [98.4 F (36.9 C)-100 F (37.8 C)] 98.6 F (37 C) (05/23 0540) Pulse Rate:  [58-70] 61 (05/23 0540) Resp:  [16-18] 16 (05/23 0540) BP: (111-130)/(69-82) 130/72 mmHg (05/23 0540) SpO2:  [94 %-98 %] 98 % (05/23 0540)  Intake/Output from previous day: 05/22 0701 - 05/23 0700 In: 500 [P.O.:480; I.V.:20] Out: 976 [Urine:975; Stool:1] Intake/Output this shift:    Physical exam the patient is alert and oriented. He is speaking well. His strength is normal. His wound is healing well.  Lab Results:  Recent Labs  01/05/16 0634  WBC 10.6*  HGB 13.7  HCT 41.1  PLT 385   BMET  Recent Labs  01/05/16 0634  NA 139  K 3.9  CL 103  CO2 25  GLUCOSE 99  BUN 6  CREATININE 0.58*  CALCIUM 9.2    Studies/Results: No results found.  Assessment/Plan: Postop day #7: We are awaiting skilled nursing facility placement to complete his course of IV antibiotics at the direction of infectious disease.  LOS: 8 days     Jaela Yepez D 01/07/2016, 8:04 AM

## 2016-01-07 NOTE — Progress Notes (Signed)
Pt discharged to skilled nursing facility [facility]. PICC line left in place in R upper arm.  Discharge information and prescription given to PTAR. Will transport from facility via stretcher to nonemergent transport. Wendee Copp

## 2016-01-07 NOTE — Progress Notes (Signed)
Speech Language Pathology Treatment: Cognitive-Linquistic  Patient Details Name: Jeffery Prince MRN: [id number] DOB: 1966/03/31 Today's Date: 01/07/2016 Time: [id number] SLP Time Calculation (min) (ACUTE ONLY): 40 min  Assessment / Plan / Recommendation Clinical Impression  Pt seen for skilled SLP treatment - he reports improvement with expressive language but is not at baseline.  SLP advanced goal from min assist for basic needs to mod I for basic needs and mod assistance for complex information.  Pt provided communication board after he demonstrated appropriate use with Mod I to use for time pressure/frustrating situations.  He typically answers questions in single word or very short phrase responses and reports this to be baseline.  Education level is 8th grade only and pt reports he does not read/write well prior to admission.  Broca's type aphasia persists and use of visual, verbal, phonemic cueing is helpful to maximize expressive language.  SLP provided pt with verbal, visual compensation strategies and had pt demonstrate use with max assistance :  eg using circumlocution, gestures, communication board, mental rehearsal prior to presentation.    Recommend continue SLP at SNF to maximize linguistic abilities.  Pt agreeable to plan.    HPI HPI: This 50 y.o. male transferred from [facility] after LOC. 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 He underwent Lt frontal craniotomy 12/31/15. PMH includes: ischemic cardiomyopathy, MI, CAD, HTN, tobacco abuse.      SLP Plan  Continue with current plan of care     Recommendations   SNF follow up to maximize expressive communication              Follow up Recommendations: 24 hour supervision/assistance;Skilled Nursing facility Plan: Continue with current plan of care     [place], [place] [facility] SLP [phone]

## 2016-01-08 DIAGNOSIS — K089 Disorder of teeth and supporting structures, unspecified: Secondary | ICD-10-CM | POA: Insufficient documentation

## 2016-01-08 DIAGNOSIS — R4701 Aphasia: Secondary | ICD-10-CM | POA: Insufficient documentation

## 2016-02-20 ENCOUNTER — Inpatient Hospital Stay: Payer: Self-pay | Admitting: Internal Medicine

## 2017-06-03 IMAGING — CT CT MAXILLOFACIAL W/ CM
2 of 3 series · 11 of 47 positions shown, 13 images · IV contrast (iopamidol)
Comparison: Brain MRI 12/31/2015 and earlier.

CLINICAL DATA: 49-year-old male with syncope. Cerebral abscess.
Left facial pain since this morning. Initial encounter.

EXAM:
CT MAXILLOFACIAL WITH CONTRAST
TECHNIQUE: Multidetector CT imaging of the maxillofacial structures was
performed with intravenous contrast. Multiplanar CT image
reconstructions were also generated. A small metallic BB was placed
on the right temple in order to reliably differentiate right from
left.
CONTRAST:  75mL [id number] IOPAMIDOL ([id number]) INJECTION 61%

[Series 207: cor st · coronal · 0.34mm/px · 8 of 60 slices shown, 10 images]
[im 7/60  brain]
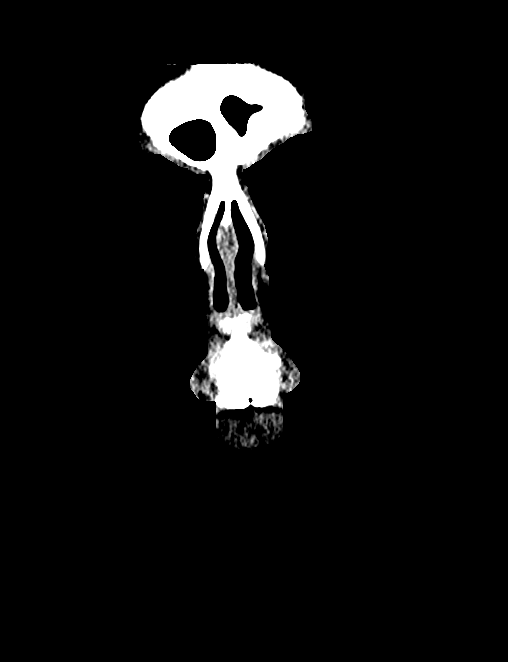
[im 7/60  bone]
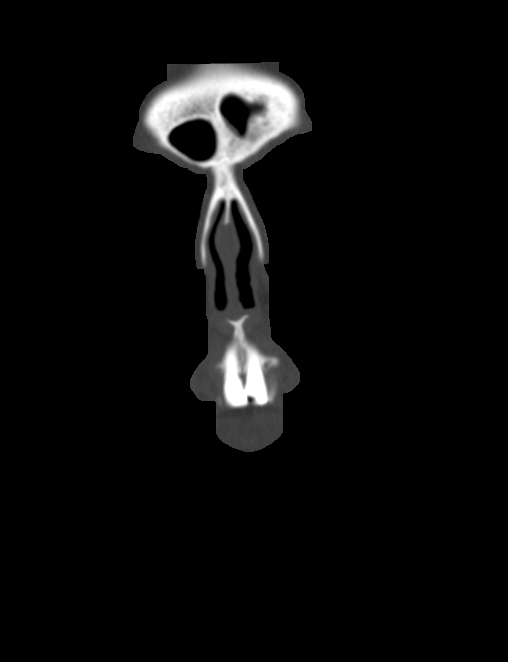
[im 14/60  bone]
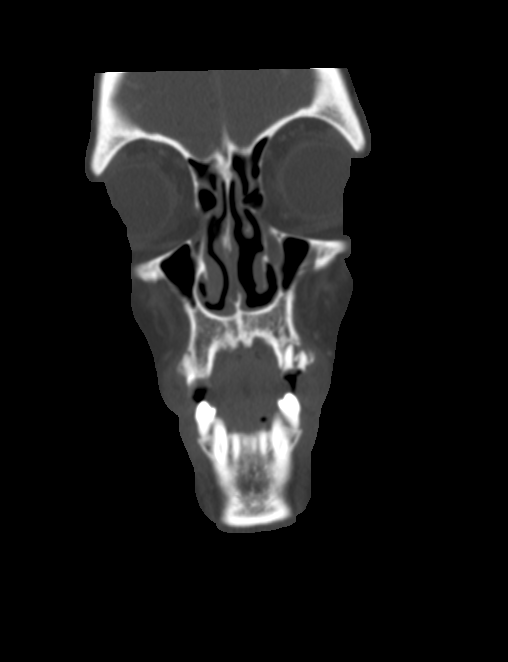
[im 20/60  bone]
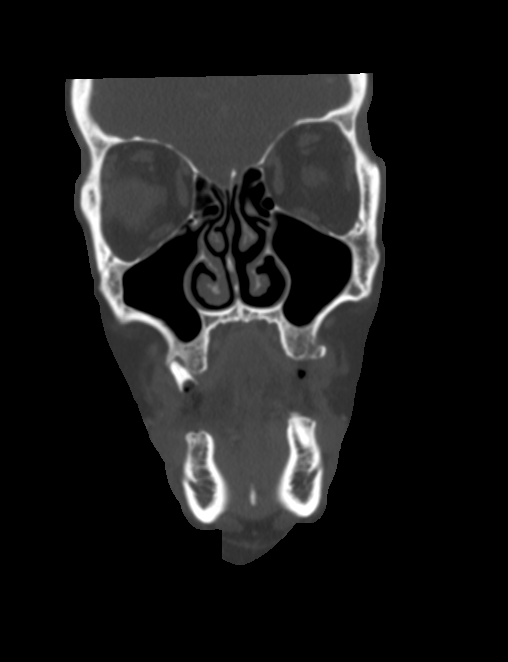
[im 27/60  bone]
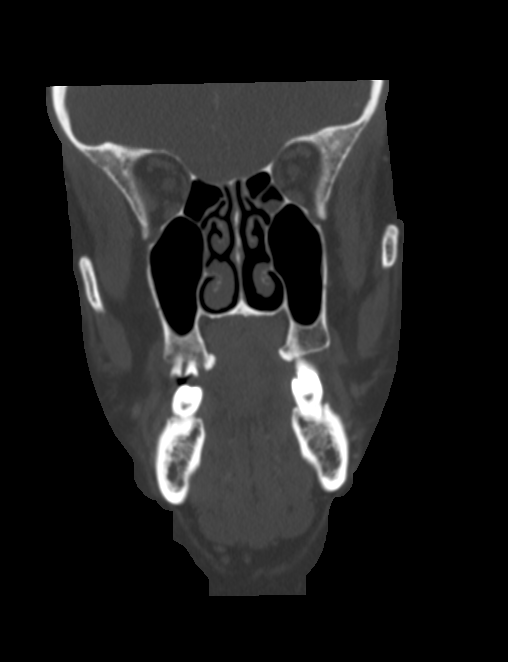
[im 33/60  brain]
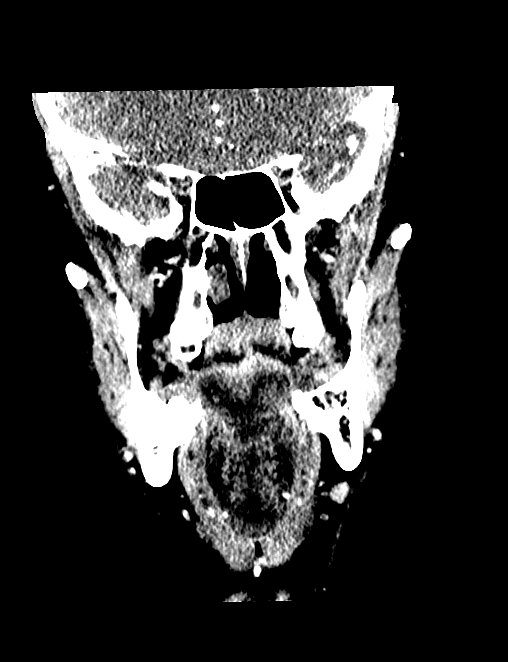
[im 33/60  bone]
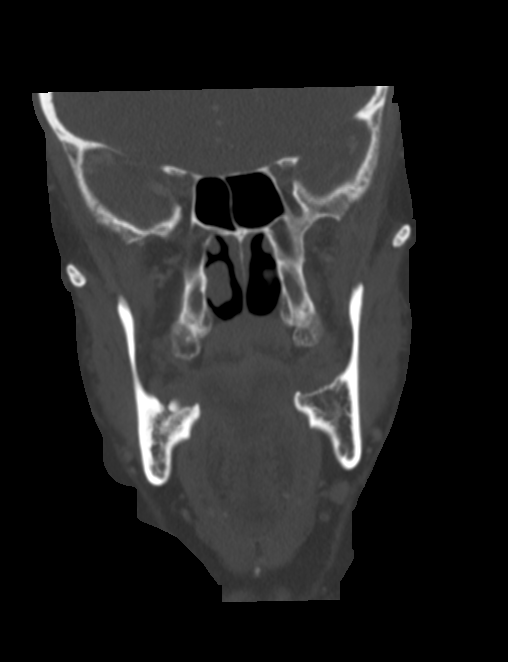
[im 40/60  bone]
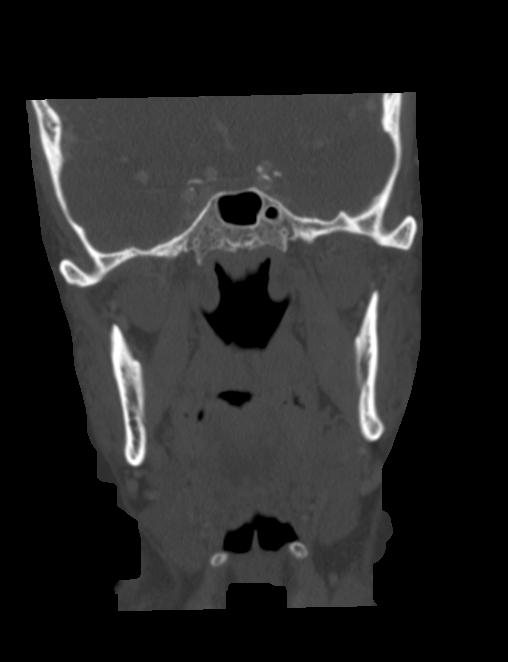
[im 46/60  bone]
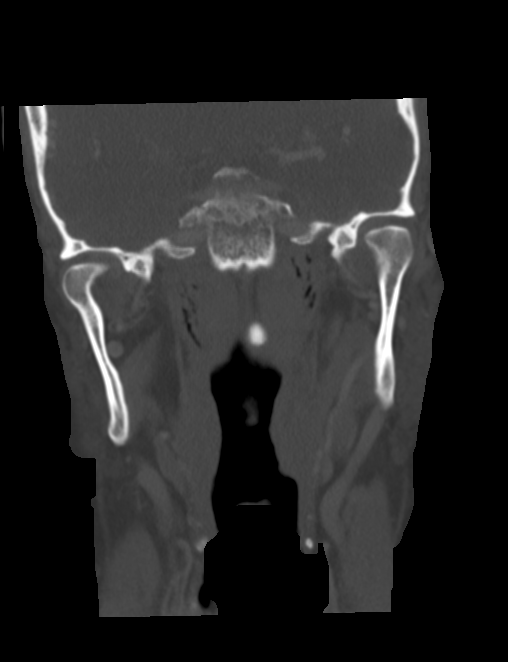
[im 53/60  bone]
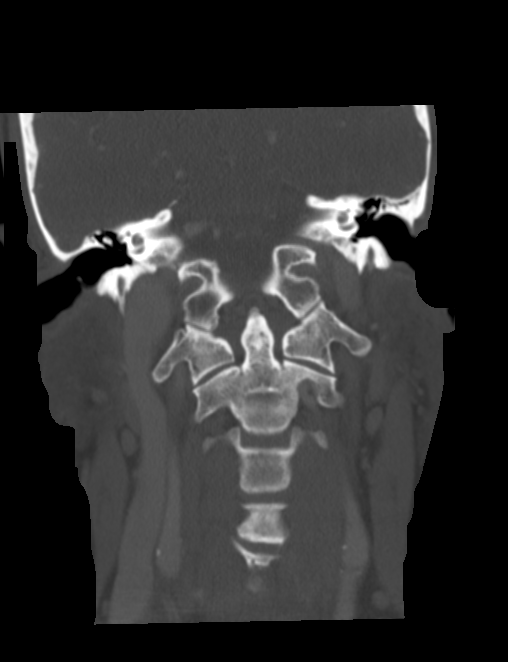

[Series 208: sag st · sagittal · 0.34mm/px · 3 of 73 slices shown]
[im 25/73  bone]
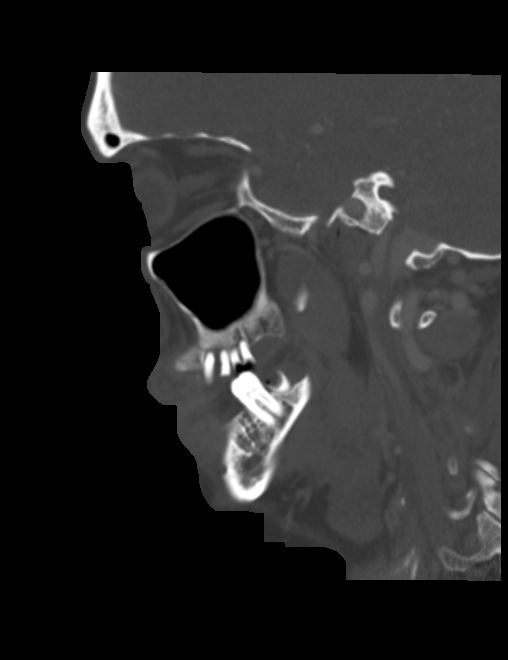
[im 37/73  bone]
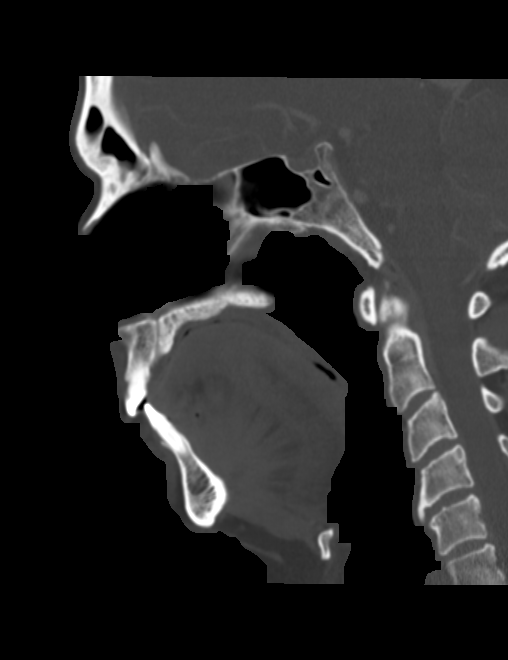
[im 49/73  bone]
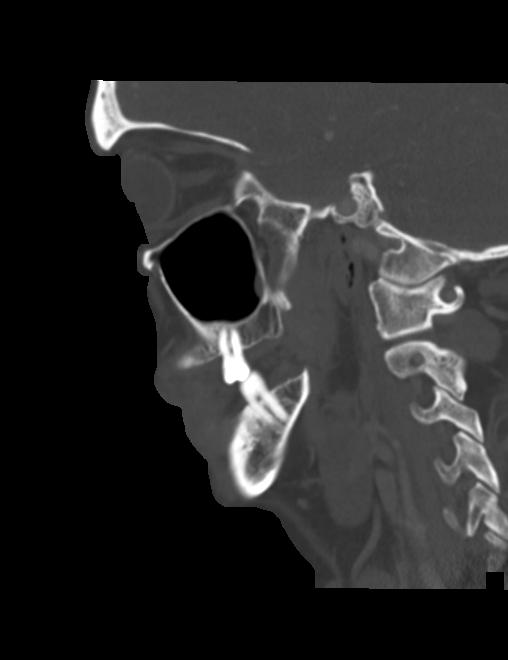

[11 of 47 positions shown; findings below may reference images not displayed]

FINDINGS: Grossly stable visualized brain parenchyma. The level of the left
cerebral abscess is not included. Prominent anterior left frontal
lobe cortical vein is enhancing and appear stable.

Calcified atherosclerosis at the skull base. Major vascular
structures in the neck and at the skullbase are patent, including
the left IJ.

Visualized Pharynx and larynx: Negative. Mild retained secretions in
the nasopharynx. Negative parapharyngeal and retropharyngeal spaces.

Salivary glands: Negative sublingual space, submandibular glands and
parotid glands.

Lymph nodes: No upper cervical lymphadenopathy.

Orbits: No orbits soft tissue abnormality.

Mastoids and visualized paranasal sinuses: Trace paranasal sinus
mucosal thickening. No paranasal sinus fluid level or bubbly
opacity. Bilateral tympanic cavities are clear. Bilateral mastoids
are clear.

Skeleton: Right greater than left frequent poor dentition. Mandible
intact. No acute facial fracture. No acute osseous abnormality
identified.

No superficial soft tissue inflammation identified in the face.
IMPRESSION: 1. No abnormality identified to explain acute facial pain. There is
right greater than left poor dentition.
2. Stable visualized brain parenchyma, the level of the left frontal
abscess is not included. See also Brain MRI report 12/31/2015.
